# Patient Record
Sex: Female | Born: 1990 | Race: White | Hispanic: No | Marital: Married | Smoking: Current every day smoker
Health system: Southern US, Community
[De-identification: ages and names within clinical notes are randomized; demographics above are authoritative.]

## PROBLEM LIST (undated history)

## (undated) DIAGNOSIS — F319 Bipolar disorder, unspecified: Secondary | ICD-10-CM

## (undated) DIAGNOSIS — B2790 Infectious mononucleosis, unspecified without complication: Secondary | ICD-10-CM

## (undated) DIAGNOSIS — F909 Attention-deficit hyperactivity disorder, unspecified type: Secondary | ICD-10-CM

## (undated) DIAGNOSIS — K900 Celiac disease: Secondary | ICD-10-CM

## (undated) DIAGNOSIS — M1990 Unspecified osteoarthritis, unspecified site: Secondary | ICD-10-CM

## (undated) DIAGNOSIS — J45909 Unspecified asthma, uncomplicated: Secondary | ICD-10-CM

## (undated) DIAGNOSIS — N83209 Unspecified ovarian cyst, unspecified side: Secondary | ICD-10-CM

## (undated) DIAGNOSIS — B009 Herpesviral infection, unspecified: Secondary | ICD-10-CM

## (undated) DIAGNOSIS — F4310 Post-traumatic stress disorder, unspecified: Secondary | ICD-10-CM

## (undated) DIAGNOSIS — F329 Major depressive disorder, single episode, unspecified: Secondary | ICD-10-CM

## (undated) DIAGNOSIS — T7840XA Allergy, unspecified, initial encounter: Secondary | ICD-10-CM

## (undated) DIAGNOSIS — F419 Anxiety disorder, unspecified: Secondary | ICD-10-CM

## (undated) DIAGNOSIS — I639 Cerebral infarction, unspecified: Secondary | ICD-10-CM

## (undated) DIAGNOSIS — F32A Depression, unspecified: Secondary | ICD-10-CM

## (undated) HISTORY — PX: APPENDECTOMY: SHX54

## (undated) HISTORY — DX: Infectious mononucleosis, unspecified without complication: B27.90

## (undated) HISTORY — DX: Cerebral infarction, unspecified: I63.9

## (undated) HISTORY — DX: Unspecified asthma, uncomplicated: J45.909

## (undated) HISTORY — DX: Unspecified osteoarthritis, unspecified site: M19.90

## (undated) HISTORY — DX: Bipolar disorder, unspecified: F31.9

## (undated) HISTORY — DX: Unspecified ovarian cyst, unspecified side: N83.209

## (undated) HISTORY — DX: Attention-deficit hyperactivity disorder, unspecified type: F90.9

## (undated) HISTORY — DX: Allergy, unspecified, initial encounter: T78.40XA

## (undated) HISTORY — DX: Herpesviral infection, unspecified: B00.9

---
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
## 2010-07-08 DIAGNOSIS — B2790 Infectious mononucleosis, unspecified without complication: Secondary | ICD-10-CM

## 2010-07-08 HISTORY — DX: Infectious mononucleosis, unspecified without complication: B27.90

## 2011-02-21 ENCOUNTER — Emergency Department (HOSPITAL_COMMUNITY)
Admission: EM | Admit: 2011-02-21 | Discharge: 2011-02-22 | Disposition: A | Discharge status: Other Acute Inpatient Hospital | Payer: BC Managed Care – PPO | Attending: Emergency Medicine | Admitting: Emergency Medicine

## 2011-02-21 DIAGNOSIS — R45851 Suicidal ideations: Secondary | ICD-10-CM | POA: Insufficient documentation

## 2011-02-21 DIAGNOSIS — Z79899 Other long term (current) drug therapy: Secondary | ICD-10-CM | POA: Insufficient documentation

## 2011-02-21 DIAGNOSIS — F329 Major depressive disorder, single episode, unspecified: Secondary | ICD-10-CM | POA: Insufficient documentation

## 2011-02-21 DIAGNOSIS — F3289 Other specified depressive episodes: Secondary | ICD-10-CM | POA: Insufficient documentation

## 2011-02-21 DIAGNOSIS — R197 Diarrhea, unspecified: Secondary | ICD-10-CM | POA: Insufficient documentation

## 2011-02-21 DIAGNOSIS — R112 Nausea with vomiting, unspecified: Secondary | ICD-10-CM | POA: Insufficient documentation

## 2011-02-21 LAB — RAPID URINE DRUG SCREEN, HOSP PERFORMED
Amphetamines: NOT DETECTED
Barbiturates: NOT DETECTED
Benzodiazepines: NOT DETECTED
Cocaine: NOT DETECTED
Tetrahydrocannabinol: POSITIVE — AB

## 2011-02-21 LAB — COMPREHENSIVE METABOLIC PANEL
ALT: 11 U/L (ref 0–35)
AST: 12 U/L (ref 0–37)
Albumin: 4.6 g/dL (ref 3.5–5.2)
CO2: 25 mEq/L (ref 19–32)
Calcium: 10 mg/dL (ref 8.4–10.5)
Chloride: 103 mEq/L (ref 96–112)
Creatinine, Ser: 0.68 mg/dL (ref 0.50–1.10)
GFR calc non Af Amer: >60 mL/min (ref >60)
Sodium: 140 mEq/L (ref 135–145)

## 2011-02-21 LAB — CBC
Platelets: 230 10*3/uL (ref 150–400)
RBC: 4.71 MIL/uL (ref 3.87–5.11)
RDW: 12.6 % (ref 11.5–15.5)
WBC: 9.7 10*3/uL (ref 4.0–10.5)

## 2011-02-21 LAB — DIFFERENTIAL
Basophils Relative: 0 % (ref 0–1)
Eosinophils Absolute: 0.1 10*3/uL (ref 0.0–0.7)
Eosinophils Relative: 1 % (ref 0–5)
Neutrophils Relative %: 79 % — ABNORMAL HIGH (ref 43–77)

## 2011-02-22 ENCOUNTER — Inpatient Hospital Stay (HOSPITAL_COMMUNITY)
Admission: AD | Admit: 2011-02-22 | Discharge: 2011-02-25 | DRG: 430 | Disposition: A | Discharge status: Home / Self Care | Payer: BC Managed Care – PPO | Source: Ambulatory Visit | Attending: Psychiatry | Admitting: Psychiatry

## 2011-02-22 DIAGNOSIS — R45851 Suicidal ideations: Secondary | ICD-10-CM

## 2011-02-22 DIAGNOSIS — R197 Diarrhea, unspecified: Secondary | ICD-10-CM

## 2011-02-22 DIAGNOSIS — Z6379 Other stressful life events affecting family and household: Secondary | ICD-10-CM

## 2011-02-22 DIAGNOSIS — F319 Bipolar disorder, unspecified: Principal | ICD-10-CM

## 2011-02-22 DIAGNOSIS — R112 Nausea with vomiting, unspecified: Secondary | ICD-10-CM

## 2011-02-22 DIAGNOSIS — Z818 Family history of other mental and behavioral disorders: Secondary | ICD-10-CM

## 2011-02-23 DIAGNOSIS — F3130 Bipolar disorder, current episode depressed, mild or moderate severity, unspecified: Secondary | ICD-10-CM

## 2011-02-24 LAB — TSH: TSH: 1.238 u[IU]/mL (ref 0.350–4.500)

## 2011-02-24 LAB — T4, FREE: Free T4: 1.19 ng/dL (ref 0.80–1.80)

## 2011-03-11 NOTE — Assessment & Plan Note (Signed)
NAMEMARGARETH, Shelby Bennett NO.:  [id number]  MEDICAL RECORD NO.:  [id number]  LOCATION:  0981                          FACILITY:  [facility]  PHYSICIAN:  Franchot Gallo, MD     DATE OF BIRTH:  [birth year]  DATE OF ADMISSION:  02/22/2011 DATE OF DISCHARGE:                      PSYCHIATRIC ADMISSION ASSESSMENT   HISTORY OF PRESENT ILLNESS:  This is a 20 year old single white female. She apparently came here to the [facility] and promptly vomited, so she was sent to [facility] for medical clearance.  She reported to them that she had been having vomiting and diarrhea for the past 4 days.  She also reported that she was suicidal and stated, "if I had a gun, I would shoot myself."  Apparently, she lost a friend to an OD back in April.  This resulted in the ultimate breakup between her and her boyfriend of 4 years.  She abruptly left.  She states that she worries about things she cannot control, especially at night, and she has many psychosocial stressors.  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  PAST PSYCHIATRIC HISTORY:  She was admitted here as an adolescent in 2007.  SOCIAL HISTORY:  She is a high school graduate in 2008.  She has never married.  She has no children.  She is employed as a Teacher, adult education.  FAMILY HISTORY:  Her whole family has depression.  Her mother is bipolar.  Her father is an addict.  ALCOHOL AND DRUG HISTORY:  She herself usually uses marijuana on a daily basis, although since her breakup with her boyfriend, she has been trying to stop.  She, however, was positive on her urine.  PRIMARY CARE PROVIDER:  She sees a PA at [facility], Ms. Leotis Shames.  MEDICAL PROBLEMS:  She does not have any that have been diagnosed although for months now she has been having diarrhea and vomiting.  MEDICATIONS:  She restarted an old  prescription for Prozac 40 mg p.o. daily about a week ago.  She is also prescribed Klonopin 1 mg to help her sleep at night and 0.5 to help abort panic attacks.  She states that she prefers self-soothing methods, and she practices meditation and breathing although she does on occasion use the Klonopin.  DRUG ALLERGIES:  VOLTAREN AND EQUETRO.  POSITIVE PHYSICAL FINDINGS:  This is well-developed, well-nourished white female who appears her stated age of 73.  She reports a 30-pound weight loss over the past 6 months that was unintended.  Her temperature is afebrile 97.8 to 98.5.  Her pulse ranged from 61-75.  Respirations were 18.  Blood pressure was 108/70 to 123/73.  She had no measurable alcohol.  She had no remarkable findings on her CBC, and she had no abnormalities of her B-Met.  MENTAL STATUS EXAM:  Today she is alert and oriented.  She was appropriately groomed, dressed and nourished.  Her speech is not pressured.  Her mood was slightly labile.  Her thought processes were fairly clear, rational and goal oriented.  She prefers therapy to medications.  Judgment and insight are fair.  Concentration and  memory are intact.  Intelligence is at least average.  She reports having had suicidal ideation just because she is tired of all this, and she is tired of dealing with her anxiety, but no attempts or gestures.  She is not homicidal, and she does not have any auditory or visual hallucinations.  DIAGNOSES:  Axis I:  Bipolar disorder NOS, although she wants to be evaluated for premenstrual dysphoric disorder. Axis II:  Deferred. Axis III:  Psychosomatic nausea, vomiting and diarrhea from her anxiety. Axis IV:  Grief about loss of various relationships. Axis V:  GAF 35.  PLAN:  The plan is to admit for safety and stabilization.  Dr. Dub Mikes was going to think about what medicine to prescribe for her.  She saw a therapist a few years ago, and if the therapist accepts her insurance, she  would like to go back to [employer].     [name], P.A.-C.   ______________________________ Franchot Gallo, MD    MD/MEDQ  D:  02/23/2011  T:  02/23/2011  Job:  [id number]  Electronically Signed by Jaci Lazier ADAMS P.A.-C. on 03/09/2011 12:04:12 PM Electronically Signed by Franchot Gallo MD on 03/11/2011 05:10:42 PM

## 2011-03-14 NOTE — Discharge Summary (Signed)
  NAMEALAJIA, Shelby Bennett NO.:  [id number]  MEDICAL RECORD NO.:  [id number]  LOCATION:  0508                          FACILITY:  [facility]  PHYSICIAN:  Franchot Gallo, MD     DATE OF BIRTH:  1991/05/06  DATE OF ADMISSION:  02/22/2011 DATE OF DISCHARGE:  02/25/2011                              DISCHARGE SUMMARY   REASON FOR ADMISSION:  This is a 20 year old single female who came with initial symptoms of vomiting and diarrhea for the past 4 days and reported in the emergency room that if she had a gun she would shoot herself.  She recently lost a friend to an overdose.  FINAL IMPRESSION:  Axis I:  Bipolar disorder NOS. AXIS II:  Deferred. Axis III:  Frequent GI upset. Axis IV:  Recent death of friend.  Recent breakup.  Serious illness in family. Axis V:  GAF at discharge is 70.  Her CBC within normal limits.  No abnormalities of her BMET.  SIGNIFICANT FINDINGS:  The patient was admitted to the adult milieu for safety and stabilization.  She was participating in groups.  She reported that her nausea and vomiting were probably related to stress. We resumed her Klonopin every 4 hours as needed for anxiety and her Prozac was discontinued at that time.  Paxil was restarted, and the patient still did not sleep well even with resuming her Klonopin.  On the day of discharge, the patient did report good sleep and good appetite, having mild depressive symptoms, rating it a 2 on a scale of 1- 10, and adamantly denying any suicidal or homicidal thoughts or psychotic symptoms, rating her anxiety a 3 on a scale of 1-10.  She was reporting minimal panic attacks.  She was reporting futuristic thinking and rating her hopelessness as 0 on a scale of 1 to 10.  The patient was provided education on suicide prevention and safety planning.  She reported good support from her siblings, and the patient was anxious for discharge.  Her discharge medications:  Paxil 10 mg daily, Klonopin 1  mg taking 1/2 as needed for anxiety and 1 mg at bedtime if needed for sleep.  She was to stop taking her Prozac.  Sprintec:  The patient had her own supply. The patient was take 1 tablet daily.  Her followup appointment was with Dr. Tinnie Gens, phone number [phone], on August 22 at 10:00 a.m., and Micheline Chapman, phone number [phone].  The patient was to call to schedule her appointment.     Landry Corporal, N.P.   ______________________________ Franchot Gallo, MD    JO/MEDQ  D:  03/13/2011  T:  03/13/2011  Job:  [id number]  Electronically Signed by Limmie PatriciaP. on 03/14/2011 09:08:27 AM Electronically Signed by Franchot Gallo MD on 03/14/2011 01:39:05 PM

## 2011-07-21 ENCOUNTER — Ambulatory Visit (INDEPENDENT_AMBULATORY_CARE_PROVIDER_SITE_OTHER): Payer: BC Managed Care – PPO

## 2011-07-21 DIAGNOSIS — R059 Cough, unspecified: Secondary | ICD-10-CM

## 2011-07-21 DIAGNOSIS — R05 Cough: Secondary | ICD-10-CM

## 2011-07-21 DIAGNOSIS — J029 Acute pharyngitis, unspecified: Secondary | ICD-10-CM

## 2011-07-21 DIAGNOSIS — F341 Dysthymic disorder: Secondary | ICD-10-CM

## 2011-08-05 ENCOUNTER — Ambulatory Visit (INDEPENDENT_AMBULATORY_CARE_PROVIDER_SITE_OTHER): Payer: BC Managed Care – PPO

## 2011-08-05 DIAGNOSIS — B373 Candidiasis of vulva and vagina: Secondary | ICD-10-CM

## 2011-08-31 ENCOUNTER — Ambulatory Visit (INDEPENDENT_AMBULATORY_CARE_PROVIDER_SITE_OTHER): Payer: BC Managed Care – PPO | Admitting: Family Medicine

## 2011-08-31 VITALS — BP 104/68 | HR 76 | Temp 98.7°F | Resp 16 | Ht 68.5 in | Wt 143.8 lb

## 2011-08-31 DIAGNOSIS — J029 Acute pharyngitis, unspecified: Secondary | ICD-10-CM

## 2011-08-31 LAB — POCT RAPID STREP A (OFFICE): Rapid Strep A Screen: NEGATIVE

## 2011-08-31 MED ORDER — CEFDINIR 300 MG PO CAPS: 300.0000 mg | ORAL_CAPSULE | Freq: Two times a day (BID) | ORAL | Status: DC

## 2011-08-31 MED ORDER — FLUCONAZOLE 150 MG PO TABS: 150.0000 mg | ORAL_TABLET | Freq: Once | ORAL | Status: AC

## 2011-08-31 MED ORDER — MAGIC MOUTHWASH: 5.0000 mL | Freq: Three times a day (TID) | ORAL | Status: DC | PRN

## 2011-08-31 NOTE — Progress Notes (Signed)
21 yo who was diagnosed with strep 2 weeks ago in ED and given shot and treated with ten days of amoxicillin but then developed sorethroat again two days ago with pus today.  Associated with fatigue and cough  Obj:  NAD Neck is supple  With left sided lymph node Throat is red with exudates on left Chest is clear  Results for orders placed in visit on 08/31/11  POCT RAPID STREP A (OFFICE)      Component Value Range   Rapid Strep A Screen Negative  Negative     A:  Pharyngitis  P:  Cefdinir Magic mouthwash

## 2011-09-02 ENCOUNTER — Other Ambulatory Visit: Payer: Self-pay | Admitting: Family Medicine

## 2011-09-02 MED ORDER — CLONAZEPAM 1 MG PO TABS: 1.0000 mg | ORAL_TABLET | Freq: Every day | ORAL | Status: DC

## 2011-09-05 ENCOUNTER — Encounter (HOSPITAL_COMMUNITY): Payer: Self-pay | Admitting: *Deleted

## 2011-09-05 ENCOUNTER — Emergency Department (HOSPITAL_COMMUNITY)
Admission: EM | Admit: 2011-09-05 | Discharge: 2011-09-05 | Disposition: A | Discharge status: Home / Self Care | Payer: BC Managed Care – PPO | Attending: Emergency Medicine | Admitting: Emergency Medicine

## 2011-09-05 DIAGNOSIS — B9689 Other specified bacterial agents as the cause of diseases classified elsewhere: Secondary | ICD-10-CM | POA: Insufficient documentation

## 2011-09-05 DIAGNOSIS — R5383 Other fatigue: Secondary | ICD-10-CM | POA: Insufficient documentation

## 2011-09-05 DIAGNOSIS — R1032 Left lower quadrant pain: Secondary | ICD-10-CM | POA: Insufficient documentation

## 2011-09-05 DIAGNOSIS — N760 Acute vaginitis: Secondary | ICD-10-CM | POA: Insufficient documentation

## 2011-09-05 DIAGNOSIS — R634 Abnormal weight loss: Secondary | ICD-10-CM | POA: Insufficient documentation

## 2011-09-05 DIAGNOSIS — R509 Fever, unspecified: Secondary | ICD-10-CM | POA: Insufficient documentation

## 2011-09-05 DIAGNOSIS — A499 Bacterial infection, unspecified: Secondary | ICD-10-CM | POA: Insufficient documentation

## 2011-09-05 DIAGNOSIS — R5381 Other malaise: Secondary | ICD-10-CM | POA: Insufficient documentation

## 2011-09-05 DIAGNOSIS — B2790 Infectious mononucleosis, unspecified without complication: Secondary | ICD-10-CM | POA: Insufficient documentation

## 2011-09-05 DIAGNOSIS — R197 Diarrhea, unspecified: Secondary | ICD-10-CM | POA: Insufficient documentation

## 2011-09-05 DIAGNOSIS — R1012 Left upper quadrant pain: Secondary | ICD-10-CM | POA: Insufficient documentation

## 2011-09-05 DIAGNOSIS — R61 Generalized hyperhidrosis: Secondary | ICD-10-CM | POA: Insufficient documentation

## 2011-09-05 HISTORY — DX: Major depressive disorder, single episode, unspecified: F32.9

## 2011-09-05 HISTORY — DX: Depression, unspecified: F32.A

## 2011-09-05 HISTORY — DX: Celiac disease: K90.0

## 2011-09-05 HISTORY — DX: Anxiety disorder, unspecified: F41.9

## 2011-09-05 LAB — URINE MICROSCOPIC-ADD ON

## 2011-09-05 LAB — URINALYSIS, ROUTINE W REFLEX MICROSCOPIC
Bilirubin Urine: NEGATIVE
Ketones, ur: 40 mg/dL — AB
Nitrite: NEGATIVE
Protein, ur: NEGATIVE mg/dL
Urobilinogen, UA: 1 mg/dL (ref 0.0–1.0)
pH: 6.5 (ref 5.0–8.0)

## 2011-09-05 LAB — DIFFERENTIAL
Basophils Absolute: 0.1 10*3/uL (ref 0.0–0.1)
Basophils Relative: 2 % — ABNORMAL HIGH (ref 0–1)
Eosinophils Absolute: 0 10*3/uL (ref 0.0–0.7)
Lymphocytes Relative: 41 % (ref 12–46)
Monocytes Absolute: 0.4 10*3/uL (ref 0.1–1.0)
Neutrophils Relative %: 49 % (ref 43–77)

## 2011-09-05 LAB — CBC
MCH: 31 pg (ref 26.0–34.0)
MCHC: 34.4 g/dL (ref 30.0–36.0)
Platelets: 199 10*3/uL (ref 150–400)
RDW: 12.6 % (ref 11.5–15.5)

## 2011-09-05 LAB — BASIC METABOLIC PANEL
BUN: 13 mg/dL (ref 6–23)
GFR calc non Af Amer: >90 mL/min (ref >90)
Glucose, Bld: 75 mg/dL (ref 70–99)
Potassium: 3.5 mEq/L (ref 3.5–5.1)

## 2011-09-05 LAB — MONONUCLEOSIS SCREEN: Mono Screen: NEGATIVE

## 2011-09-05 LAB — WET PREP, GENITAL

## 2011-09-05 MED ORDER — METOCLOPRAMIDE HCL 5 MG/ML IJ SOLN
10.0000 mg | Freq: Once | INTRAMUSCULAR | Status: AC
  Administered 2011-09-05: 10 mg via INTRAMUSCULAR
  Filled 2011-09-05: qty 2

## 2011-09-05 MED ORDER — DEXAMETHASONE 1 MG/ML PO CONC
10.0000 mg | Freq: Once | ORAL | Status: AC
  Administered 2011-09-05: 10 mg via ORAL
  Filled 2011-09-05: qty 10

## 2011-09-05 MED ORDER — DIPHENHYDRAMINE HCL 50 MG/ML IJ SOLN
25.0000 mg | Freq: Once | INTRAMUSCULAR | Status: AC
  Administered 2011-09-05: 25 mg via INTRAMUSCULAR
  Filled 2011-09-05: qty 1

## 2011-09-05 MED ORDER — METRONIDAZOLE 500 MG PO TABS: 500.0000 mg | ORAL_TABLET | Freq: Two times a day (BID) | ORAL | Status: AC

## 2011-09-05 NOTE — ED Notes (Signed)
Pt states "I have been having trouble with my tonsils since 1/24, actually 7 days before that, am on the 2nd course of abx and it's still the same, my tonsils were the size of golf balls, can someone just take them out, I also am having abd pain"; pt indicates LLQ pain. Has been having diarrhea; pt presents with exudate to tonsils that are red & swollen.

## 2011-09-05 NOTE — Discharge Instructions (Signed)
Bacterial Vaginosis     Bacterial vaginosis (BV) is a vaginal infection where the normal balance of bacteria in the vagina is disrupted. The normal balance is then replaced by an overgrowth of certain bacteria. There are several different kinds of bacteria that can cause BV. BV is the most common vaginal infection in women of childbearing age.  CAUSES    · The cause of BV is not fully understood. BV develops when there is an increase or imbalance of harmful bacteria.   · Some activities or behaviors can upset the normal balance of bacteria in the vagina and put women at increased risk including:   · Having a new sex partner or multiple sex partners.   · Douching.   · Using an intrauterine device (IUD) for contraception.   · It is not clear what role sexual activity plays in the development of BV. However, women that have never had sexual intercourse are rarely infected with BV.   Women do not get BV from toilet seats, bedding, swimming pools or from touching objects around them.    SYMPTOMS    · Grey vaginal discharge.   · A fish-like odor with discharge, especially after sexual intercourse.   · Itching or burning of the vagina and vulva.   · Burning or pain with urination.   · Some women have no signs or symptoms at all.   DIAGNOSIS    Your caregiver must examine the vagina for signs of BV. Your caregiver will perform lab tests and look at the sample of vaginal fluid through a microscope. They will look for bacteria and abnormal cells (clue cells), a pH test higher than 4.5, and a positive amine test all associated with BV.    RISKS AND COMPLICATIONS    · Pelvic inflammatory disease (PID).   · Infections following gynecology surgery.   · Developing HIV.   · Developing herpes virus.   TREATMENT    Sometimes BV will clear up without treatment. However, all women with symptoms of BV should be treated to avoid complications, especially if gynecology surgery is planned. Female partners generally do not need to be treated.  However, BV may spread between female sex partners so treatment is helpful in preventing a recurrence of BV.    · BV may be treated with antibiotics. The antibiotics come in either pill or vaginal cream forms. Either can be used with nonpregnant or pregnant women, but the recommended dosages differ. These antibiotics are not harmful to the baby.   · BV can recur after treatment. If this happens, a second round of antibiotics will often be prescribed.   · Treatment is important for pregnant women. If not treated, BV can cause a premature delivery, especially for a pregnant woman who had a premature birth in the past. All pregnant women who have symptoms of BV should be checked and treated.   · For chronic reoccurrence of BV, treatment with a type of prescribed gel vaginally twice a week is helpful.   HOME CARE INSTRUCTIONS    · Finish all medication as directed by your caregiver.   · Do not have sex until treatment is completed.   · Tell your sexual partner that you have a vaginal infection. They should see their caregiver and be treated if they have problems, such as a mild rash or itching.   · Practice safe sex. Use condoms. Only have 1 sex partner.   PREVENTION    Basic prevention steps can help reduce   the risk of upsetting the natural balance of bacteria in the vagina and developing BV:  · Do not have sexual intercourse (be abstinent).   · Do not douche.   · Use all of the medicine prescribed for treatment of BV, even if the signs and symptoms go away.   · Tell your sex partner if you have BV. That way, they can be treated, if needed, to prevent reoccurrence.   SEEK MEDICAL CARE IF:    · Your symptoms are not improving after 3 days of treatment.   · You have increased discharge, pain, or fever.   MAKE SURE YOU:    · Understand these instructions.   · Will watch your condition.   · Will get help right away if you are not doing well or get worse.   FOR MORE INFORMATION    Division of STD Prevention (DSTDP), Centers  for Disease Control and Prevention: www.cdc.gov/std  American Social Health Association (ASHA): www.ashastd.org    Document Released: 06/24/2005 Document Revised: 03/06/2011 Document Reviewed: 12/15/2008  ExitCare® Patient Information ©2012 [employer].Bacterial Vaginosis     Bacterial vaginosis (BV) is a vaginal infection where the normal balance of bacteria in the vagina is disrupted. The normal balance is then replaced by an overgrowth of certain bacteria. There are several different kinds of bacteria that can cause BV. BV is the most common vaginal infection in women of childbearing age.  CAUSES    · The cause of BV is not fully understood. BV develops when there is an increase or imbalance of harmful bacteria.   · Some activities or behaviors can upset the normal balance of bacteria in the vagina and put women at increased risk including:   · Having a new sex partner or multiple sex partners.   · Douching.   · Using an intrauterine device (IUD) for contraception.   · It is not clear what role sexual activity plays in the development of BV. However, women that have never had sexual intercourse are rarely infected with BV.   Women do not get BV from toilet seats, bedding, swimming pools or from touching objects around them.    SYMPTOMS    · Grey vaginal discharge.   · A fish-like odor with discharge, especially after sexual intercourse.   · Itching or burning of the vagina and vulva.   · Burning or pain with urination.   · Some women have no signs or symptoms at all.   DIAGNOSIS    Your caregiver must examine the vagina for signs of BV. Your caregiver will perform lab tests and look at the sample of vaginal fluid through a microscope. They will look for bacteria and abnormal cells (clue cells), a pH test higher than 4.5, and a positive amine test all associated with BV.    RISKS AND COMPLICATIONS    · Pelvic inflammatory disease (PID).   · Infections following gynecology surgery.   · Developing HIV.    · Developing herpes virus.   TREATMENT    Sometimes BV will clear up without treatment. However, all women with symptoms of BV should be treated to avoid complications, especially if gynecology surgery is planned. Female partners generally do not need to be treated. However, BV may spread between female sex partners so treatment is helpful in preventing a recurrence of BV.    · BV may be treated with antibiotics. The antibiotics come in either pill or vaginal cream forms. Either can be used with nonpregnant or   pregnant women, but the recommended dosages differ. These antibiotics are not harmful to the baby.   · BV can recur after treatment. If this happens, a second round of antibiotics will often be prescribed.   · Treatment is important for pregnant women. If not treated, BV can cause a premature delivery, especially for a pregnant woman who had a premature birth in the past. All pregnant women who have symptoms of BV should be checked and treated.   · For chronic reoccurrence of BV, treatment with a type of prescribed gel vaginally twice a week is helpful.   HOME CARE INSTRUCTIONS    · Finish all medication as directed by your caregiver.   · Do not have sex until treatment is completed.   · Tell your sexual partner that you have a vaginal infection. They should see their caregiver and be treated if they have problems, such as a mild rash or itching.   · Practice safe sex. Use condoms. Only have 1 sex partner.   PREVENTION    Basic prevention steps can help reduce the risk of upsetting the natural balance of bacteria in the vagina and developing BV:  · Do not have sexual intercourse (be abstinent).   · Do not douche.   · Use all of the medicine prescribed for treatment of BV, even if the signs and symptoms go away.   · Tell your sex partner if you have BV. That way, they can be treated, if needed, to prevent reoccurrence.   SEEK MEDICAL CARE IF:    · Your symptoms are not improving after 3 days of treatment.    · You have increased discharge, pain, or fever.   MAKE SURE YOU:    · Understand these instructions.   · Will watch your condition.   · Will get help right away if you are not doing well or get worse.   FOR MORE INFORMATION    Division of STD Prevention (DSTDP), Centers for Disease Control and Prevention: www.cdc.gov/std  American Social Health Association (ASHA): www.ashastd.org    Document Released: 06/24/2005 Document Revised: 03/06/2011 Document Reviewed: 12/15/2008  ExitCare® Patient Information ©2012 [employer].

## 2011-09-05 NOTE — ED Provider Notes (Signed)
History     CSN: [id number]  Arrival date & time 09/05/11  1413   First MD Initiated Contact with Patient 09/05/11 1525      Chief Complaint  Patient presents with  . Sore Throat    (Consider location/radiation/quality/duration/timing/severity/associated sxs/prior treatment) HPI Comments: Patient reports that she has had a sore throat for the past month.  She has finished a course of Amoxicillin and is currently on day 5 of Omnicef.  She reports that her throat is not getting better.  She reports that she has also been more fatigued over the past month and has had night sweats and chills.  She reports that yesterday she began having LLQ and LUQ abdominal pain.  She reports that she has been seen by her PCP for this complaint but has never been tested for mono.  She reports that she has had fevers intermittently over the past month.  She has also had diarrhea over the past 3 days.  She denies any nausea or vomiting. She also reports that she has lost over 50 lbs in the past year.  She has seen PCP for this complaint and is frustrated that more work up was not done.  She reports that she is sexually active and that her and her partner do not use protection.  The history is provided by the patient.    Past Medical History  Diagnosis Date  . Depression   . Anxiety   . Celiac disease     Past Surgical History  Procedure Date  . Appendectomy     No family history on file.  History  Substance Use Topics  . Smoking status: Former Smoker -- 0.4 packs/day    Quit date: 08/29/2011  . Smokeless tobacco: Not on file  . Alcohol Use: Yes     rarely    OB History    Grav Para Term Preterm Abortions TAB SAB Ect Mult Living                  Review of Systems  Constitutional: Positive for fever, chills, appetite change and fatigue.  HENT: Positive for sore throat. Negative for ear pain, trouble swallowing, neck pain, neck stiffness and voice change.   Respiratory: Negative for  shortness of breath.   Gastrointestinal: Positive for abdominal pain. Negative for nausea and vomiting.  Genitourinary: Positive for dysuria, hematuria and vaginal discharge. Negative for urgency, flank pain, decreased urine volume and genital sores.  Skin: Negative for rash.  Neurological: Negative for dizziness, syncope and light-headedness.  Hematological: Negative for adenopathy.    Allergies  Voltaren and Zithromax  Home Medications   Current Outpatient Rx  Name Route Sig Dispense Refill  . MAGIC MOUTHWASH Oral Take 5 mLs by mouth 3 (three) times daily as needed. 120 mL 0  . CEFDINIR 300 MG PO CAPS Oral Take 300 mg by mouth 2 (two) times daily. Started on 08-31-11 for 7 days. Pt's on day 5 of therapy    . CLONAZEPAM 1 MG PO TABS Oral Take 1 tablet (1 mg total) by mouth daily. 30 tablet 0  . NORGESTIMATE-ETH ESTRADIOL 0.25-35 MG-MCG PO TABS Oral Take 1 tablet by mouth daily.    Marland Kitchen PAROXETINE HCL 20 MG PO TABS Oral Take 20 mg by mouth every morning.    Marland Kitchen PHENOL 1.4 % MT LIQD Mouth/Throat Use as directed 1 spray in the mouth or throat as needed.      BP 99/59  Pulse 68  Temp(Src) 98.5 F (  36.9 C) (Oral)  Resp 18  Wt 140 lb 6.4 oz (63.685 kg)  SpO2 100%  LMP 08/26/2011  Physical Exam  Nursing note and vitals reviewed. Constitutional: She is oriented to person, place, and time. She appears well-developed and well-nourished. No distress.  HENT:  Head: Normocephalic and atraumatic. No trismus in the jaw.  Nose: Nose normal.  Mouth/Throat: Uvula is midline and mucous membranes are normal. No uvula swelling. Oropharyngeal exudate, posterior oropharyngeal edema and posterior oropharyngeal erythema present. No tonsillar abscesses.       Uvula midline No muffled voice.  No drooling. No signs of peritonsillar abscess.  Neck: Normal range of motion. Neck supple.  Cardiovascular: Normal rate, regular rhythm and normal heart sounds.   Pulmonary/Chest: Effort normal and breath sounds  normal.  Abdominal: Soft. Normal appearance and bowel sounds are normal. She exhibits no mass. There is no hepatosplenomegaly. There is tenderness in the left upper quadrant and left lower quadrant. There is no rebound and no guarding.  Genitourinary: Vagina normal and uterus normal. There is no lesion on the right labia. There is no lesion on the left labia. Cervix exhibits discharge. Cervix exhibits no motion tenderness. Right adnexum displays no mass, no tenderness and no fullness. Left adnexum displays no mass, no tenderness and no fullness.  Musculoskeletal: Normal range of motion.  Neurological: She is alert and oriented to person, place, and time.  Skin: Skin is warm and dry. No rash noted. She is not diaphoretic. No erythema.  Psychiatric: She has a normal mood and affect.    ED Course  Procedures (including critical care time)   Labs Reviewed  MONONUCLEOSIS SCREEN  URINALYSIS, ROUTINE W REFLEX MICROSCOPIC   No results found.   No diagnosis found.    MDM  Patient with sore throat and fatigue for the past month.  She has been treated with a course of Amoxicillin and is currently on day 5 of Omnicef and sore throat not improving.  Therefore, do not feel that patient's symptoms are caused by strep.  Mono spot done in the ED which was negative.  However, CBC with diff showed atypical lymphocytes, which is consistent with mononucleosis.  Due to symptoms along with atypical lymphs patient educated on mono and instructed to avoid contact sports/activities for 6 weeks.  Regarding the weight loss, patient was instructed to follow up with PCP for additional testing, including thyroid testing.  HIV antibody testing also ordered with patient's permission, which was negative.  GC/Chlamydia still pending.  Wet prep showed BV and patient given Rx for Flagyl.  Patient instructed not to drink EtOH while on this medication.        Pascal Lux [place], PA-C 09/06/11 0140

## 2011-09-06 LAB — PATHOLOGIST SMEAR REVIEW

## 2011-09-06 LAB — GC/CHLAMYDIA PROBE AMP, GENITAL
Chlamydia, DNA Probe: NEGATIVE
GC Probe Amp, Genital: NEGATIVE

## 2011-09-06 LAB — POCT PREGNANCY, URINE: Preg Test, Ur: NEGATIVE

## 2011-09-10 NOTE — ED Provider Notes (Signed)
Medical screening examination/treatment/procedure(s) were conducted as a shared visit with non-physician practitioner(s) and myself.  I personally evaluated the patient during the encounter.  Patient with a sore throat for several weeks. Negative strep test. On exam she does have an exudative pharyngitis. Suspect this is viral. Monospot was negative but doesn't completely rule out mononucleosis. Patient has multiple other complaints including significant unintentional weight loss. Physical exam is otherwise nonfocal. Patient is in no acute distress. She seemed dynamically stable. Discussed that although her other symptoms he further evaluation, do not feel that needs states he cannot emergent basis. Patient is apparently unhappy with her current primary care provider. Stressed importance of getting a new one then following that. Resources referral is provided.  Raeford Razor, MD 09/10/11 2242

## 2011-10-04 ENCOUNTER — Telehealth: Payer: Self-pay

## 2011-10-04 NOTE — Telephone Encounter (Signed)
PT STATES SHE IS OUT OF HER CLONAZEPAM AND NEED ASAP. THE PHARMACY HAVE BEEN TRYING TO FAX. PLEASE CALL [phone]   [facility] [address]

## 2011-10-05 NOTE — Telephone Encounter (Signed)
Chart please  Alycia Rossetti

## 2011-10-07 MED ORDER — CLONAZEPAM 1 MG PO TABS: 1.0000 mg | ORAL_TABLET | Freq: Every day | ORAL | Status: DC

## 2011-10-07 NOTE — Telephone Encounter (Signed)
Pt called in requesting her refill on Clonazepam 1 mg.  Refill called into [facility] on [address].

## 2011-10-07 NOTE — Telephone Encounter (Signed)
Chart pulled to PA 

## 2012-04-14 ENCOUNTER — Ambulatory Visit (INDEPENDENT_AMBULATORY_CARE_PROVIDER_SITE_OTHER): Payer: BC Managed Care – PPO | Admitting: Physician Assistant

## 2012-04-14 VITALS — BP 124/82 | HR 69 | Temp 98.3°F | Resp 17 | Ht 69.0 in | Wt 145.0 lb

## 2012-04-14 DIAGNOSIS — F419 Anxiety disorder, unspecified: Secondary | ICD-10-CM | POA: Insufficient documentation

## 2012-04-14 DIAGNOSIS — Z23 Encounter for immunization: Secondary | ICD-10-CM

## 2012-04-14 DIAGNOSIS — IMO0001 Reserved for inherently not codable concepts without codable children: Secondary | ICD-10-CM

## 2012-04-14 DIAGNOSIS — F341 Dysthymic disorder: Secondary | ICD-10-CM

## 2012-04-14 DIAGNOSIS — F329 Major depressive disorder, single episode, unspecified: Secondary | ICD-10-CM | POA: Insufficient documentation

## 2012-04-14 DIAGNOSIS — Z309 Encounter for contraceptive management, unspecified: Secondary | ICD-10-CM

## 2012-04-14 DIAGNOSIS — F909 Attention-deficit hyperactivity disorder, unspecified type: Secondary | ICD-10-CM | POA: Insufficient documentation

## 2012-04-14 DIAGNOSIS — F32A Depression, unspecified: Secondary | ICD-10-CM | POA: Insufficient documentation

## 2012-04-14 MED ORDER — CLONAZEPAM 1 MG PO TABS: 1.0000 mg | ORAL_TABLET | Freq: Every evening | ORAL | Status: DC | PRN

## 2012-04-14 MED ORDER — NORGESTIMATE-ETH ESTRADIOL 0.25-35 MG-MCG PO TABS: 1.0000 | ORAL_TABLET | Freq: Every day | ORAL | Status: DC

## 2012-04-14 MED ORDER — PAROXETINE HCL 20 MG PO TABS: 20.0000 mg | ORAL_TABLET | ORAL | Status: DC

## 2012-04-14 NOTE — Assessment & Plan Note (Signed)
Restart paroxetine and continue alprazolam at HS prn.  She has good insight.

## 2012-04-14 NOTE — Progress Notes (Signed)
Subjective:    Patient ID: Shelby Bennett, female    DOB: August 17, 1990, 21 y.o.   MRN: [id number]  HPI This 21 y.o. female presents for evaluation of depression, anxiety and insomnia.  Needs a refill of Paxil and Xanax.  Ran out of Paxil about a week ago, and since then has had thoughts of self and other harm, but no intention or plan.  She's been irritable, easy to anger, and tearful.  These were all controlled prior to running out of medication.  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  Their family enjoyed the celebration.  Her sister is in the process of adopting the child she is fostering from [place].  Review of Systems  No chest pain, SOB, HA, dizziness, vision change, N/V, diarrhea, constipation, dysuria, urinary urgency or frequency, myalgias, arthralgias or rash.   Past Medical History  Diagnosis Date  . Anxiety   . Celiac disease   . Depression   . Arthritis   . ADHD (attention deficit hyperactivity disorder)     Past Surgical History  Procedure Date  . Appendectomy     Prior to Admission medications   Medication Sig Start Date End Date Taking? Authorizing Provider  clonazePAM (KLONOPIN) 1 MG tablet Take 1 tablet (1 mg total) by mouth daily. 10/07/11  Yes Rickard Patience, PA-C  norgestimate-ethinyl estradiol (ORTHO-CYCLEN,SPRINTEC,PREVIFEM) 0.25-35 MG-MCG tablet Take 1 tablet by mouth daily.   Yes Historical Provider, MD  PARoxetine (PAXIL) 20 MG tablet Take 20 mg by mouth every morning.   Yes Historical Provider, MD    Allergies  Allergen Reactions  . 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 Zithromax (Azithromycin Dihydrate) Diarrhea and Nausea And Vomiting    History   Social History  . Marital Status: Single    Spouse Name: n/a    Number of Children: 0  . Years of Education: 12+   Occupational History  . massage therapist     [employer]   Social History Main Topics  . Smoking  status: Former Smoker -- 0.7 packs/day for 7 years    Types: Cigarettes    Quit date: 08/29/2011  . Smokeless tobacco: Never Used  . Alcohol Use: Yes     rarely  . Drug Use: 2 per week    Special: Marijuana  . Sexually Active: Not on file   Other Topics Concern  . Not on file   Social History Narrative   Lives alone.    Family History  Problem Relation Age of Onset  . Mental illness Sister     PMDD  . Cervical cancer Sister     s/p ablation  . Mental illness Brother     depression  . Mental illness Brother     depression       Objective:   Physical Exam  Blood pressure 124/82, pulse 69, temperature 98.3 F (36.8 C), temperature source Oral, resp. rate 17, height 5\' 9"  (1.753 m), weight 145 lb (65.772 kg), last menstrual period 04/13/2012, SpO2 100.00%. Body mass index is 21.41 kg/(m^2). Well-developed, well nourished WF who is awake, alert and oriented, in NAD. HEENT: [place]/AT, sclera and conjunctiva are clear.   Neck: supple, non-tender, no lymphadenopathy, thyromegaly. Heart: RRR, no murmur Lungs: normal effort, CTA Extremities: no cyanosis, clubbing or edema. Skin: warm and dry without rash. Psychologic: good mood and appropriate affect, normal speech and behavior.     Assessment &  Plan:   1. Depression  PARoxetine (PAXIL) 20 MG tablet  2. Anxiety and depression  clonazePAM (KLONOPIN) 1 MG tablet,   4. Contraception  norgestimate-ethinyl estradiol (ORTHO-CYCLEN,SPRINTEC,PREVIFEM) 0.25-35 MG-MCG tablet  5. Need for influenza vaccination  Flu vaccine greater than or equal to 3yo preservative free IM; RTC in the next 3-6 months for breast and pap.

## 2012-04-14 NOTE — Patient Instructions (Signed)
Come in for a breast exam and pap test in the next 3-6 months.

## 2012-06-22 ENCOUNTER — Ambulatory Visit (INDEPENDENT_AMBULATORY_CARE_PROVIDER_SITE_OTHER): Payer: BC Managed Care – PPO | Admitting: Emergency Medicine

## 2012-06-22 VITALS — BP 121/80 | HR 73 | Temp 98.5°F | Resp 17 | Ht 70.0 in | Wt 144.0 lb

## 2012-06-22 DIAGNOSIS — IMO0001 Reserved for inherently not codable concepts without codable children: Secondary | ICD-10-CM

## 2012-06-22 DIAGNOSIS — F329 Major depressive disorder, single episode, unspecified: Secondary | ICD-10-CM

## 2012-06-22 DIAGNOSIS — F411 Generalized anxiety disorder: Secondary | ICD-10-CM

## 2012-06-22 DIAGNOSIS — J309 Allergic rhinitis, unspecified: Secondary | ICD-10-CM

## 2012-06-22 DIAGNOSIS — J018 Other acute sinusitis: Secondary | ICD-10-CM

## 2012-06-22 MED ORDER — NORGESTIMATE-ETH ESTRADIOL 0.25-35 MG-MCG PO TABS: 1.0000 | ORAL_TABLET | Freq: Every day | ORAL | Status: DC

## 2012-06-22 MED ORDER — PAROXETINE HCL 20 MG PO TABS: 20.0000 mg | ORAL_TABLET | ORAL | Status: DC

## 2012-06-22 MED ORDER — LORATADINE 10 MG PO TABS: 10.0000 mg | ORAL_TABLET | Freq: Every day | ORAL | Status: DC

## 2012-06-22 MED ORDER — CLONAZEPAM 1 MG PO TABS: 1.0000 mg | ORAL_TABLET | Freq: Every evening | ORAL | Status: DC | PRN

## 2012-06-22 NOTE — Progress Notes (Signed)
[facility] [address], [place] [address] [phone]  Date:  06/22/2012   Name:  Shelby Bennett   DOB:  1990-09-25   MRN:  [id number]  PCP:  Drue Harr,CHELLE, PA-C    Chief Complaint: No chief complaint on file.   History of Present Illness:  Shelby Bennett is a 22 y.o. very pleasant female patient who presents with the following:  Says that her dishwasher leaked and as a consequence she has black mold growing in the cabinet and flooring.  She has a history of allergy to mold and is very concerned about her health and the risks the mold poses to her.  Since the leak, she has experienced headaches, migraines, and allergic rhinitis and itchy watery eyes.  Denies wheezing, coughing, or shortness of breath.    Patient Active Problem List  Diagnosis  . Anxiety and depression  . ADHD (attention deficit hyperactivity disorder)    Past Medical History  Diagnosis Date  . Anxiety   . Celiac disease   . Depression   . Arthritis   . ADHD (attention deficit hyperactivity disorder)   . Allergy   . Ulcer   . Stroke     Past Surgical History  Procedure Date  . Appendectomy     History  Substance Use Topics  . Smoking status: Current Every Day Smoker -- 0.7 packs/day for 7 years    Types: Cigarettes    Last Attempt to Quit: 08/29/2011  . Smokeless tobacco: Never Used  . Alcohol Use: Yes     Comment: rarely    Family History  Problem Relation Age of Onset  . Mental illness Sister     PMDD  . Cervical cancer Sister     s/p ablation  . Mental illness Brother     depression  . Mental illness Brother     depression  . Mental illness Mother     bipolar  . Migraines Father     Allergies  Allergen Reactions  . 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 Zithromax (Azithromycin Dihydrate) Diarrhea and Nausea And Vomiting  . Latex Swelling  . Equetro (Carbamazepine Er)     Suicidal rash    Medication list  has been reviewed and updated.  Current Outpatient Prescriptions on File Prior to Visit  Medication Sig Dispense Refill  . clonazePAM (KLONOPIN) 1 MG tablet Take 1 tablet (1 mg total) by mouth at bedtime as needed for anxiety.  30 tablet  0  . norgestimate-ethinyl estradiol (ORTHO-CYCLEN,SPRINTEC,PREVIFEM) 0.25-35 MG-MCG tablet Take 1 tablet by mouth daily.  3 Package  4  . PARoxetine (PAXIL) 20 MG tablet Take 1 tablet (20 mg total) by mouth every morning.  90 tablet  3    Review of Systems:  As per HPI, otherwise negative.    Physical Examination: Filed Vitals:   06/22/12 1223  BP: 121/80  Pulse: 73  Temp: 98.5 F (36.9 C)  Resp: 17   Filed Vitals:   06/22/12 1223  Height: 5\' 10"  (1.778 m)  Weight: 144 lb (65.318 kg)   Body mass index is 20.66 kg/(m^2). Ideal Body Weight: Weight in (lb) to have BMI = 25: 173.9   GEN: WDWN, NAD, Non-toxic, A & O x 3 HEENT: Atraumatic, Normocephalic. Neck supple. No masses, No LAD. Ears and Nose: No external deformity. CV: RRR, No M/G/R. No JVD. No thrill. No extra heart sounds. PULM: CTA B, no wheezes, crackles,  rhonchi. No retractions. No resp. distress. No accessory muscle use. ABD: S, NT, ND, +BS. No rebound. No HSM. EXTR: No c/c/e NEURO Normal gait.  PSYCH: Normally interactive. Conversant. Not depressed or anxious appearing.  Calm demeanor.    Assessment and Plan: Allergy to mold Depression and anxiety Refill meds claritin  Carmelina Dane, MD

## 2012-06-22 NOTE — Patient Instructions (Addendum)
Allergic Rhinitis  Allergic rhinitis is when the mucous membranes in the nose respond to allergens. Allergens are particles in the air that cause your body to have an allergic reaction. This causes you to release allergic antibodies. Through a chain of events, these eventually cause you to release histamine into the blood stream (hence the use of antihistamines). Although meant to be protective to the body, it is this release that causes your discomfort, such as frequent sneezing, congestion and an itchy runny nose.    CAUSES    The pollen allergens may come from grasses, trees, and weeds. This is seasonal allergic rhinitis, or "hay fever." Other allergens cause year-round allergic rhinitis (perennial allergic rhinitis) such as house dust mite allergen, pet dander and mold spores.    SYMPTOMS     Nasal stuffiness (congestion).   Runny, itchy nose with sneezing and tearing of the eyes.   There is often an itching of the mouth, eyes and ears.  It cannot be cured, but it can be controlled with medications.  DIAGNOSIS    If you are unable to determine the offending allergen, skin or blood testing may find it.  TREATMENT     Avoid the allergen.   Medications and allergy shots (immunotherapy) can help.   Hay fever may often be treated with antihistamines in pill or nasal spray forms. Antihistamines block the effects of histamine. There are over-the-counter medicines that may help with nasal congestion and swelling around the eyes. Check with your caregiver before taking or giving this medicine.  If the treatment above does not work, there are many new medications your caregiver can prescribe. Stronger medications may be used if initial measures are ineffective. Desensitizing injections can be used if medications and avoidance fails. Desensitization is when a patient is given ongoing shots until the body becomes less sensitive to the allergen. Make sure you follow up with your caregiver if problems continue.   SEEK MEDICAL CARE IF:     You develop fever (more than 100.5 F (38.1 C).   You develop a cough that does not stop easily (persistent).   You have shortness of breath.   You start wheezing.   Symptoms interfere with normal daily activities.  Document Released: 03/19/2001 Document Revised: 09/16/2011 Document Reviewed: 09/28/2008  Exit[employer] Patient Information 2013 [employer].

## 2012-08-14 ENCOUNTER — Telehealth: Payer: Self-pay

## 2012-08-14 NOTE — Telephone Encounter (Signed)
We can not do this without an office visit. Called her voice mail box not set up yet.

## 2012-08-14 NOTE — Telephone Encounter (Signed)
PT STATES SHE KNOW HER HAVE A YEAST INFECTION AND WOULD LIKE [place] TO CALL IN A DIFLUCAN PILL FOR HER PLEASE. THAT IS ALL SHE NEED. PLEASE CALL [phone]   [facility] [address]

## 2012-08-15 ENCOUNTER — Telehealth: Payer: Self-pay

## 2012-08-15 NOTE — Telephone Encounter (Signed)
PT NEEDS A REFILL OF DIFLUCAN.  SAID DR. TOLD HER TO CALL IF SHE GOT ANOTHER ONE TO JUST CALL us AND WE WOULD CALL HER IN SCRIPT.  SHE NO LONGER HAS INSURANCE AND IS POSITIVE THAT IT IS A YEAST INFECTION.  NEEDS IT CALLED INTO [facility] IN [place].  THIS IS A DIFFERENT PHARMACY FROM THE ONE WE HAVE ON FILE.  [phone]

## 2012-08-15 NOTE — Telephone Encounter (Signed)
Patient called again stating she is now leaving [place] and would like the Diflucan sent to [facility] on [address]. States she needs 2 and she is having a really bad yeast infection. Please call patient when sent to pharmacy.

## 2012-08-17 NOTE — Telephone Encounter (Signed)
Per last telephone note, pt needs an OV.  There are OTC yeast infection medications that she can use if she does not want to come in.

## 2012-08-18 NOTE — Telephone Encounter (Signed)
Called and advised her of this. She is using natural treatment now.

## 2012-08-18 NOTE — Telephone Encounter (Signed)
Not appropriate to do this without office visit.  Called patient. Voice mail not set up yet.

## 2012-10-09 ENCOUNTER — Inpatient Hospital Stay (HOSPITAL_COMMUNITY)
Admission: RE | Admit: 2012-10-09 | Discharge: 2012-10-12 | DRG: 885 | Disposition: A | Discharge status: Home / Self Care | Payer: 59 | Attending: Psychiatry | Admitting: Psychiatry

## 2012-10-09 ENCOUNTER — Encounter (HOSPITAL_COMMUNITY): Payer: Self-pay | Admitting: *Deleted

## 2012-10-09 DIAGNOSIS — F909 Attention-deficit hyperactivity disorder, unspecified type: Secondary | ICD-10-CM

## 2012-10-09 DIAGNOSIS — F39 Unspecified mood [affective] disorder: Secondary | ICD-10-CM | POA: Diagnosis present

## 2012-10-09 DIAGNOSIS — F319 Bipolar disorder, unspecified: Secondary | ICD-10-CM

## 2012-10-09 DIAGNOSIS — F329 Major depressive disorder, single episode, unspecified: Secondary | ICD-10-CM

## 2012-10-09 DIAGNOSIS — F3160 Bipolar disorder, current episode mixed, unspecified: Secondary | ICD-10-CM

## 2012-10-09 DIAGNOSIS — F411 Generalized anxiety disorder: Secondary | ICD-10-CM

## 2012-10-09 DIAGNOSIS — K900 Celiac disease: Secondary | ICD-10-CM | POA: Diagnosis present

## 2012-10-09 DIAGNOSIS — F3110 Bipolar disorder, current episode manic without psychotic features, unspecified: Secondary | ICD-10-CM | POA: Diagnosis present

## 2012-10-09 DIAGNOSIS — Z79899 Other long term (current) drug therapy: Secondary | ICD-10-CM

## 2012-10-09 LAB — COMPREHENSIVE METABOLIC PANEL
CO2: 28 mEq/L (ref 19–32)
Calcium: 9.1 mg/dL (ref 8.4–10.5)
Creatinine, Ser: 0.67 mg/dL (ref 0.50–1.10)
GFR calc Af Amer: >90 mL/min (ref >90)
GFR calc non Af Amer: >90 mL/min (ref >90)
Glucose, Bld: 94 mg/dL (ref 70–99)

## 2012-10-09 LAB — URINALYSIS, ROUTINE W REFLEX MICROSCOPIC
Bilirubin Urine: NEGATIVE
Glucose, UA: NEGATIVE mg/dL
Hgb urine dipstick: NEGATIVE
Ketones, ur: NEGATIVE mg/dL
Leukocytes, UA: NEGATIVE
Nitrite: NEGATIVE
Protein, ur: NEGATIVE mg/dL
Specific Gravity, Urine: 1.017 (ref 1.005–1.030)
Urobilinogen, UA: 0.2 mg/dL (ref 0.0–1.0)
pH: 7 (ref 5.0–8.0)

## 2012-10-09 LAB — CBC
HCT: 38.8 % (ref 36.0–46.0)
Hemoglobin: 13.1 g/dL (ref 12.0–15.0)
MCH: 30.8 pg (ref 26.0–34.0)
MCHC: 33.8 g/dL (ref 30.0–36.0)
MCV: 91.1 fL (ref 78.0–100.0)
Platelets: 221 10*3/uL (ref 150–400)
RBC: 4.26 MIL/uL (ref 3.87–5.11)
RDW: 12.4 % (ref 11.5–15.5)
WBC: 7.1 10*3/uL (ref 4.0–10.5)

## 2012-10-09 LAB — RAPID URINE DRUG SCREEN, HOSP PERFORMED: Opiates: NOT DETECTED

## 2012-10-09 LAB — PREGNANCY, URINE: Preg Test, Ur: NEGATIVE

## 2012-10-09 MED ORDER — LITHIUM CARBONATE 300 MG PO CAPS
300.0000 mg | ORAL_CAPSULE | Freq: Two times a day (BID) | ORAL | Status: DC
  Administered 2012-10-09 – 2012-10-12 (×7): 300 mg via ORAL
  Filled 2012-10-09 (×9): qty 1

## 2012-10-09 MED ORDER — TRAZODONE HCL 100 MG PO TABS
100.0000 mg | ORAL_TABLET | Freq: Every evening | ORAL | Status: DC | PRN
  Administered 2012-10-09: 100 mg via ORAL
  Filled 2012-10-09: qty 1

## 2012-10-09 MED ORDER — MAGNESIUM HYDROXIDE 400 MG/5ML PO SUSP: 30.0000 mL | Freq: Every day | ORAL | Status: DC | PRN

## 2012-10-09 MED ORDER — ACETAMINOPHEN 325 MG PO TABS
650.0000 mg | ORAL_TABLET | Freq: Four times a day (QID) | ORAL | Status: DC | PRN
  Administered 2012-10-09 – 2012-10-10 (×3): 650 mg via ORAL

## 2012-10-09 MED ORDER — NICOTINE 14 MG/24HR TD PT24
MEDICATED_PATCH | TRANSDERMAL | Status: AC
  Administered 2012-10-09: 17:00:00
  Filled 2012-10-09: qty 1

## 2012-10-09 MED ORDER — NICOTINE POLACRILEX 2 MG MT GUM: 2.0000 mg | CHEWING_GUM | OROMUCOSAL | Status: DC | PRN

## 2012-10-09 MED ORDER — ALUM & MAG HYDROXIDE-SIMETH 200-200-20 MG/5ML PO SUSP
30.0000 mL | ORAL | Status: DC | PRN
  Administered 2012-10-11: 30 mL via ORAL

## 2012-10-09 NOTE — Progress Notes (Signed)
Pt asked for information about filing 50-B protection order in [facility] and information pertaining to [employer] [facility] offered in [place]. This information was printed and given to pt 10/09/12. [facility] referral faxed--currently waiting for call with appt and location (therapy and med management).
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
## 2012-10-09 NOTE — Tx Team (Signed)
Initial Interdisciplinary Treatment Plan  PATIENT STRENGTHS: (choose at least two) Ability for insight Average or above average intelligence Capable of independent living Communication skills General fund of knowledge Motivation for treatment/growth Supportive family/friends Work skills  PATIENT STRESSORS: Financial difficulties Legal issue Occupational concerns   PROBLEM LIST: Problem List/Patient Goals Date to be addressed Date deferred Reason deferred Estimated date of resolution  Suicidal thoughts 10-09-12     Homicidal thoughts 10-09-12     Anxiety 10-09-12     Depression                                     DISCHARGE CRITERIA:  Ability to meet basic life and health needs Improved stabilization in mood, thinking, and/or behavior Motivation to continue treatment in a less acute level of care Need for constant or close observation no longer present Reduction of life-threatening or endangering symptoms to within safe limits  PRELIMINARY DISCHARGE PLAN: Attend PHP/IOP Outpatient therapy Participate in family therapy Return to previous living arrangement  PATIENT/FAMIILY INVOLVEMENT: This treatment plan has been presented to and reviewed with the patient, Shelby Bennett, and/or family member.  The patient and family have been given the opportunity to ask questions and make suggestions.  Mickeal Needy 10/09/2012, 2:09 AM

## 2012-10-09 NOTE — [facility] Suicide Risk Assessment (Signed)
Suicide Risk Assessment  Admission Assessment     Nursing information obtained from:  Patient Demographic factors:  Adolescent or young adult;Caucasian;Living alone;Unemployed Current Mental Status:  Self-harm thoughts;Thoughts of violence towards others Loss Factors:  Decrease in vocational status;Legal issues Historical Factors:  Family history of mental illness or substance abuse Risk Reduction Factors:  Sense of responsibility to family;Religious beliefs about death  CLINICAL FACTORS:   Severe Anxiety and/or Agitation Depression:   Impulsivity  COGNITIVE FEATURES THAT CONTRIBUTE TO RISK:  Thought constriction (tunnel vision)    SUICIDE RISK:   Moderate:  Frequent suicidal ideation with limited intensity, and duration, some specificity in terms of plans, no associated intent, good self-control, limited dysphoria/symptomatology, some risk factors present, and identifiable protective factors, including available and accessible social support.  PLAN OF CARE: Supportive approach/coping skills                               Reassess her mood disorder; Bipolar vs. Unipolar Depression  I certify that inpatient services furnished can reasonably be expected to improve the patient's condition.  Rocklyn Mayberry A 10/09/2012, 4:45 PM

## 2012-10-09 NOTE — Progress Notes (Signed)
Took over Pt's care at 2330.  Pt is resting in bed with eyes closed, even and unlabored respirations.  Will continue to [employer]

## 2012-10-09 NOTE — Progress Notes (Signed)
Adult Psychoeducational Group Note  Date:  10/09/2012 Time:  10:00a  Group Topic/Focus:  Early Warning Signs:   The focus of this group is to help patients identify signs or symptoms they exhibit before slipping into an unhealthy state or crisis.  Participation Level:  Active  Participation Quality:  Sharing  Affect:  Appropriate  Cognitive:  Appropriate  Insight: Good  Engagement in Group:  Engaged  Modes of Intervention:  Discussion  Additional Comments:  Patient was enaged in group and was able to give some examples of how she have stay sober.  [name] 10/09/2012, 12:02 PM
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
## 2012-10-09 NOTE — Progress Notes (Signed)
Shelby Bennett has been in and out of her bed most of the day. She appears anxious,reluctant to approah and speak with staff..seen frequently standing out in the hall twiddling her hair in her fingertips. When asked by this staff nurse what she needs, if anything, she replies " I don't know". Initially, she says she had a pounding headache ( she thinks it was from caffeine withdrawal) but states this abated as the day rpogressed.  A She has attended 2 groups today. She is quite limited in her acceptance and her understanding of her disease, states she has had SI within the past 24 hrs, but willing to contract with this nurse for safety. She rates her depression and hopelessness " 9-10 ./ 8", respectively and states her DC plan includes " a job".  R Safety is in place and POC cont.
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
## 2012-10-10 DIAGNOSIS — F909 Attention-deficit hyperactivity disorder, unspecified type: Secondary | ICD-10-CM

## 2012-10-10 DIAGNOSIS — F3110 Bipolar disorder, current episode manic without psychotic features, unspecified: Secondary | ICD-10-CM | POA: Diagnosis present

## 2012-10-10 DIAGNOSIS — F309 Manic episode, unspecified: Secondary | ICD-10-CM

## 2012-10-10 MED ORDER — ASPIRIN-ACETAMINOPHEN-CAFFEINE 250-250-65 MG PO TABS
2.0000 | ORAL_TABLET | Freq: Four times a day (QID) | ORAL | Status: DC | PRN
  Administered 2012-10-10 – 2012-10-11 (×3): 2 via ORAL

## 2012-10-10 MED ORDER — HOME MED STORE IN PYXIS
1.0000 | Freq: Every day | Status: DC
  Administered 2012-10-10 – 2012-10-12 (×4): 1 via OROMUCOSAL
  Filled 2012-10-10 (×3): qty 1

## 2012-10-10 MED ORDER — GABAPENTIN 100 MG PO CAPS
200.0000 mg | ORAL_CAPSULE | Freq: Every day | ORAL | Status: DC
  Administered 2012-10-10: 200 mg via ORAL
  Filled 2012-10-10 (×4): qty 2

## 2012-10-10 NOTE — Progress Notes (Signed)
D.  Pt pleasant on approach, requested medication for headache before bed because she states if she doesn't receive it at that time she will wake up with a migraine.  Positive for evening group, interacting appropriately within milieu.  Denies SI/HI/hallucinations at this time.  A.  Support and encouragement offered, medication given as ordered.  R.  Pt remains safe on unit, will continue to monitor.

## 2012-10-10 NOTE — Progress Notes (Signed)
D) Pt has been in her bed resting all day due to a headache. Pt had tylenol this morning and it did not help. Did not attend the groups this morning because of her headache.  A) was given Excedrin migraine at 1210 for her headache rated a 10 R) Pt was able to go to group this afternoon and interact with her peers.

## 2012-10-10 NOTE — [facility] Group Notes (Signed)
[facility] LCSW Group Therapy  10/10/2012 10am  Did not attend  Sarina Ser 10/10/2012, 12:40 PM

## 2012-10-10 NOTE — Progress Notes (Signed)
[facility] MD Progress Note  10/10/2012 3:30 PM Shelby Bennett  MRN:  [id number]  Subjective: "I had Horrible night-mare last night. I was bad, awful.  I couldn't get myself to wake up from it. I don't normally dream like that and not able to wake myself up. Could it be related to my medicines. I stay anxious, but I'm feeling some better"  Diagnosis:   Axis I: ADHD, hyperactive type and Bipolar affective disorder, current episode manic Axis II: Deferred Axis III:  Past Medical History  Diagnosis Date  . Anxiety   . Celiac disease   . Depression   . Arthritis   . ADHD (attention deficit hyperactivity disorder)   . Allergy   . Ulcer   . Stroke    Axis IV: other psychosocial or environmental problems Axis V: 41-50 serious symptoms  ADL's:  Intact  Sleep: Fair  Appetite:  Good  Suicidal Ideation:  Denies Homicidal Ideation:  Denies AEB (as evidenced by):  Psychiatric Specialty Exam: Review of Systems  Constitutional: Negative.   HENT: Negative.   Eyes: Negative.   Respiratory: Negative.   Cardiovascular: Negative.   Gastrointestinal: Negative.   Genitourinary: Negative.   Musculoskeletal: Negative.   Skin: Negative.   Endo/Heme/Allergies: Negative.   Psychiatric/Behavioral: Positive for substance abuse. Negative for depression, suicidal ideas, hallucinations and memory loss. The patient is nervous/anxious and has insomnia.     Blood pressure 110/76, pulse 83, temperature 98.3 F (36.8 C), temperature source Oral, resp. rate 18, height 5\' 9"  (1.753 m), weight 64.864 kg (143 lb), last menstrual period 09/08/2012.Body mass index is 21.11 kg/(m^2).  General Appearance: Casual and Neat  Eye Contact::  Fair  Speech:  Clear and Coherent  Volume:  Normal  Mood:  "Better, but still anxious"  Affect:  Appropriate  Thought Process:  Coherent and Goal Directed  Orientation:  Full (Time, Place, and Person)  Thought Content:  Rumination  Suicidal Thoughts:  No  Homicidal  Thoughts:  No  Memory:  Immediate;   Good Recent;   Good Remote;   Good  Judgement:  Fair  Insight:  Fair  Psychomotor Activity:  Normal  Concentration:  Fair  Recall:  Good  Akathisia:  No  Handed:  Right  AIMS (if indicated):     Assets:  Desire for Improvement  Sleep:  Number of Hours: 5.75   Current Medications: Current Facility-Administered Medications  Medication Dose Route Frequency Provider Last Rate Last Dose  . alum & mag hydroxide-simeth (MAALOX/MYLANTA) 200-200-20 MG/5ML suspension 30 mL  30 mL Oral Q4H PRN Mike Craze, MD      . aspirin-acetaminophen-caffeine [facility] MIGRAINE) per tablet 2 tablet  2 tablet Oral Q6H PRN Sanjuana Kava, NP   2 tablet at 10/10/12 1210  . gabapentin (NEURONTIN) capsule 200 mg  200 mg Oral QHS Sanjuana Kava, NP      . lithium carbonate capsule 300 mg  300 mg Oral BID WC Mike Craze, MD   300 mg at 10/10/12 1156  . magnesium hydroxide (MILK OF MAGNESIA) suspension 30 mL  30 mL Oral Daily PRN Mike Craze, MD      . nicotine polacrilex (NICORETTE) gum 2 mg  2 mg Oral PRN Rachael Fee, MD        Lab Results:  Results for orders placed during the hospital encounter of 10/09/12 (from the past 48 hour(s))  CBC     Status: None   Collection Time    10/09/12  6:20 AM      Result Value Range   WBC 7.1  4.0 - 10.5 K/uL   RBC 4.26  3.87 - 5.11 MIL/uL   Hemoglobin 13.1  12.0 - 15.0 g/dL   HCT 40.9  81.1 - 91.4 %   MCV 91.1  78.0 - 100.0 fL   MCH 30.8  26.0 - 34.0 pg   MCHC 33.8  30.0 - 36.0 g/dL   RDW 78.2  95.6 - 21.3 %   Platelets 221  150 - 400 K/uL  COMPREHENSIVE METABOLIC PANEL     Status: Abnormal   Collection Time    10/09/12  6:20 AM      Result Value Range   Sodium 139  135 - 145 mEq/L   Potassium 3.4 (*) 3.5 - 5.1 mEq/L   Chloride 104  96 - 112 mEq/L   CO2 28  19 - 32 mEq/L   Glucose, Bld 94  70 - 99 mg/dL   BUN 9  6 - 23 mg/dL   Creatinine, Ser 0.86  0.50 - 1.10 mg/dL   Calcium 9.1  8.4 - 57.8 mg/dL   Total  Protein 6.6  6.0 - 8.3 g/dL   Albumin 3.6  3.5 - 5.2 g/dL   AST 12  0 - 37 U/L   ALT 9  0 - 35 U/L   Alkaline Phosphatase 28 (*) 39 - 117 U/L   Total Bilirubin 0.3  0.3 - 1.2 mg/dL   GFR calc non Af Amer >90  >90 mL/min   GFR calc Af Amer >90  >90 mL/min   Comment:            The eGFR has been calculated     using the CKD EPI equation.     This calculation has not been     validated in all clinical     situations.     eGFR's persistently     <90 mL/min signify     possible Chronic Kidney Disease.  TSH     Status: None   Collection Time    10/09/12  6:20 AM      Result Value Range   TSH 2.846  0.350 - 4.500 uIU/mL  PREGNANCY, URINE     Status: None   Collection Time    10/09/12  8:00 AM      Result Value Range   Preg Test, Ur NEGATIVE  NEGATIVE   Comment:            THE SENSITIVITY OF THIS     METHODOLOGY IS >20 mIU/mL.  URINE RAPID DRUG SCREEN (HOSP PERFORMED)     Status: Abnormal   Collection Time    10/09/12  8:00 AM      Result Value Range   Opiates NONE DETECTED  NONE DETECTED   Cocaine NONE DETECTED  NONE DETECTED   Benzodiazepines NONE DETECTED  NONE DETECTED   Amphetamines NONE DETECTED  NONE DETECTED   Tetrahydrocannabinol POSITIVE (*) NONE DETECTED   Barbiturates NONE DETECTED  NONE DETECTED   Comment:            DRUG SCREEN FOR MEDICAL PURPOSES     ONLY.  IF CONFIRMATION IS NEEDED     FOR ANY PURPOSE, NOTIFY LAB     WITHIN 5 DAYS.                LOWEST DETECTABLE LIMITS     FOR URINE DRUG SCREEN     Drug Class  Cutoff (ng/mL)     Amphetamine      1000     Barbiturate      200     Benzodiazepine   200     Tricyclics       300     Opiates          300     Cocaine          300     THC              50  URINALYSIS, ROUTINE W REFLEX MICROSCOPIC     Status: Abnormal   Collection Time    10/09/12  8:00 AM      Result Value Range   Color, Urine YELLOW  YELLOW   APPearance CLOUDY (*) CLEAR   Specific Gravity, Urine 1.017  1.005 - 1.030   pH 7.0  5.0  - 8.0   Glucose, UA NEGATIVE  NEGATIVE mg/dL   Hgb urine dipstick NEGATIVE  NEGATIVE   Bilirubin Urine NEGATIVE  NEGATIVE   Ketones, ur NEGATIVE  NEGATIVE mg/dL   Protein, ur NEGATIVE  NEGATIVE mg/dL   Urobilinogen, UA 0.2  0.0 - 1.0 mg/dL   Nitrite NEGATIVE  NEGATIVE   Leukocytes, UA NEGATIVE  NEGATIVE   Comment: MICROSCOPIC NOT DONE ON URINES WITH NEGATIVE PROTEIN, BLOOD, LEUKOCYTES, NITRITE, OR GLUCOSE <1000 mg/dL.    Physical Findings: AIMS: Facial and Oral Movements Muscles of Facial Expression: None, normal Lips and Perioral Area: None, normal Jaw: None, normal Tongue: None, normal,Extremity Movements Upper (arms, wrists, hands, fingers): None, normal Lower (legs, knees, ankles, toes): None, normal, Trunk Movements Neck, shoulders, hips: None, normal, Overall Severity Severity of abnormal movements (highest score from questions above): None, normal Incapacitation due to abnormal movements: None, normal Patient's awareness of abnormal movements (rate only patient's report): No Awareness, Dental Status Current problems with teeth and/or dentures?: No Does patient usually wear dentures?: No  CIWA:  CIWA-Ar Total: 2 COWS:     Treatment Plan Summary: Daily contact with patient to assess and evaluate symptoms and progress in treatment Medication management  Plan: Supportive approach/coping skills/relapse prevention. Will initiate Neurontin 200 mg Q bedtime for symptoms of anxiety. Discontinue Trazodone. Will add Birth Control pills to list of medicines as soon as patient's family delivers it. Encouraged out of room, participation in group sessions and application of coping skills when distressed. Will continue to monitor response to/adverse effects of medications in use to assure effectiveness. Continue to monitor mood, behavior and interaction with staff and other patients. Continue current plan of care.  Medical Decision Making Problem Points:  Established problem,  worsening (2), New problem, with additional work-up planned (4), Review of last therapy session (1) and Review of psycho-social stressors (1) Data Points:  Review of medication regiment & side effects (2) Review of new medications or change in dosage (2)  I certify that inpatient services furnished can reasonably be expected to improve the patient's condition.   Armandina Stammer I 10/10/2012, 3:30 PM

## 2012-10-11 DIAGNOSIS — F39 Unspecified mood [affective] disorder: Secondary | ICD-10-CM

## 2012-10-11 DIAGNOSIS — F341 Dysthymic disorder: Secondary | ICD-10-CM

## 2012-10-11 LAB — LITHIUM LEVEL: Lithium Lvl: <0.25 mEq/L — ABNORMAL LOW (ref 0.80–1.40)

## 2012-10-11 MED ORDER — PANTOPRAZOLE SODIUM 40 MG PO TBEC
40.0000 mg | DELAYED_RELEASE_TABLET | Freq: Every day | ORAL | Status: DC
  Filled 2012-10-11 (×2): qty 1

## 2012-10-11 MED ORDER — PANTOPRAZOLE SODIUM 40 MG PO TBEC
40.0000 mg | DELAYED_RELEASE_TABLET | Freq: Every day | ORAL | Status: DC
  Administered 2012-10-11 – 2012-10-12 (×2): 40 mg via ORAL
  Filled 2012-10-11 (×5): qty 1

## 2012-10-11 NOTE — Progress Notes (Signed)
Thankfulness  Group Note  Date: 10/11/2012 Time:  0900  Group Topic/Focus:  Identifying Thankfulness :   The focus of this group is to help patients identify  Things / people / events in their lives they are thankful for..  Participation Level:  Did Not Attend  PAdditional Comments:    10/11/2012,9:46 AM Rich Brave

## 2012-10-11 NOTE — Progress Notes (Signed)
Consult from [facility] for  Shelby Bennett for abdominal pain. Patient is a 22 year old female with history of bipolar disorder, migraine headaches. Consult requested for abdominal pain. Patient reported taking aspirin and Excedrin for headaches and thinks she has ulcers. My recommendation is to avoid aspirin and Excedrin and use protonix. If protonix is not alleviating the pain then please call again and we will see the patient.  Manson Passey [facility] [phone] Pager [phone]

## 2012-10-11 NOTE — Progress Notes (Signed)
Psychoeducational Group Note  Date:  10/11/2012 Time: 1015 Group Topic/Focus:  Making Healthy Choices:   The focus of this group is to help patients identify negative/unhealthy choices they were using prior to admission and identify positive/healthier coping strategies to replace them upon discharge.  Participation Level:  Did not attend Participation Quality:   Affect:    Cognitive:    Insight:    Engagement in Group:    Additional Comments:   Shelby Bennett 4:20 PM. 10/11/2012

## 2012-10-11 NOTE — Progress Notes (Signed)
Patient ID: Shelby Bennett, female   DOB: 10-25-1990, 22 y.o.   MRN: [id number] D)   Has been participating in the milieu this evening,attended group, went to the room outside the med window afterward where several peers were meeting to play cards, and were socializing.  Was noted to be laughing and interacting appropriately.  Had put off taking her dose of lithium at suppertime, but had come to the window to get it at the beginning of the shift, but stated she didn't want to take anything she didn't have to take, stated she didn't think the neurontin had done anything for her and refused hs dose.  Did state that the protonix was helping her stomach, however and was pleased that she was feeling better.  Somewhat guarded with staff and ltd interaction. A)  Will continue to monitor for safety, continue POC R)  Safety maintained on unit.
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
## 2012-10-11 NOTE — Progress Notes (Addendum)
Patient ID: Shelby Bennett, female   DOB: 02-02-1991, 22 y.o.   MRN: [id number] [facility] MD Progress Note  10/11/2012 3:48 PM Shelby Bennett  MRN:  [id number]  Subjective: "I still did not sleep well last night. What ever you guys are giving for what ever is not working. My mood is stabilized. I should be discharged today. Why can't I be discharged from this fucking place. I need to go home to go to the pain clinic to get help for my migraine headache pains. My stomach is hurting. I know it ulcers. All the Tylenol and Excedrin headache has washed off all the linings in my stomach. What I am doing here. I need to go home".   Diagnosis:   Axis I: ADHD, hyperactive type and Bipolar affective disorder, current episode manic Axis II: Deferred Axis III:  Past Medical History  Diagnosis Date  . Anxiety   . Celiac disease   . Depression   . Arthritis   . ADHD (attention deficit hyperactivity disorder)   . Allergy   . Ulcer   . Stroke    Axis IV: other psychosocial or environmental problems Axis V: 41-50 serious symptoms  ADL's:  Intact  Sleep: Fair  Appetite:  Good  Suicidal Ideation:  Denies Homicidal Ideation:  Denies AEB (as evidenced by):  Psychiatric Specialty Exam: Review of Systems  Constitutional: Negative.   HENT: Negative.   Eyes: Negative.   Respiratory: Negative.   Cardiovascular: Negative.   Gastrointestinal: Negative.   Genitourinary: Negative.   Musculoskeletal: Negative.   Skin: Negative.   Endo/Heme/Allergies: Negative.   Psychiatric/Behavioral: Positive for substance abuse. Negative for depression, suicidal ideas, hallucinations and memory loss. The patient is nervous/anxious and has insomnia.     Blood pressure 111/77, pulse 106, temperature 97 F (36.1 C), temperature source Oral, resp. rate 16, height 5\' 9"  (1.753 m), weight 64.864 kg (143 lb), last menstrual period 09/08/2012.Body mass index is 21.11 kg/(m^2).  General Appearance: Casual and  Neat  Eye Contact::  Fair  Speech:  Clear and Coherent  Volume:  Normal  Mood:  "Better, but still anxious", yelling  Affect:  Appropriate  Thought Process:  Coherent and Goal Directed  Orientation:  Full (Time, Place, and Person)  Thought Content:  Rumination  Suicidal Thoughts:  No  Homicidal Thoughts:  No  Memory:  Immediate;   Good Recent;   Good Remote;   Good  Judgement:  Fair  Insight:  Fair  Psychomotor Activity:  Restless  Concentration:  Fair  Recall:  Good  Akathisia:  No  Handed:  Right  AIMS (if indicated):     Assets:  Desire for Improvement  Sleep:  Number of Hours: 5.25   Current Medications: Current Facility-Administered Medications  Medication Dose Route Frequency Provider Last Rate Last Dose  . alum & mag hydroxide-simeth (MAALOX/MYLANTA) 200-200-20 MG/5ML suspension 30 mL  30 mL Oral Q4H PRN Mike Craze, MD   30 mL at 10/11/12 1006  . aspirin-acetaminophen-caffeine (EXCEDRIN MIGRAINE) per tablet 2 tablet  2 tablet Oral Q6H PRN Sanjuana Kava, NP   2 tablet at 10/11/12 0605  . gabapentin (NEURONTIN) capsule 200 mg  200 mg Oral QHS Sanjuana Kava, NP   200 mg at 10/10/12 2258  . home med stored in pyxis 1 each  1 each Mouth/Throat QAC breakfast Shuvon Rankin, NP   1 each at 10/11/12 1426  . lithium carbonate capsule 300 mg  300 mg Oral BID WC Mike Craze,  MD   300 mg at 10/11/12 1005  . magnesium hydroxide (MILK OF MAGNESIA) suspension 30 mL  30 mL Oral Daily PRN Mike Craze, MD      . nicotine polacrilex (NICORETTE) gum 2 mg  2 mg Oral PRN Rachael Fee, MD        Lab Results:  Results for orders placed during the hospital encounter of 10/09/12 (from the past 48 hour(s))  LITHIUM LEVEL     Status: Abnormal   Collection Time    10/11/12  6:30 AM      Result Value Range   Lithium Lvl <0.25 (*) 0.80 - 1.40 mEq/L    Physical Findings: AIMS: Facial and Oral Movements Muscles of Facial Expression: None, normal Lips and Perioral Area: None,  normal Jaw: None, normal Tongue: None, normal,Extremity Movements Upper (arms, wrists, hands, fingers): None, normal Lower (legs, knees, ankles, toes): None, normal, Trunk Movements Neck, shoulders, hips: None, normal, Overall Severity Severity of abnormal movements (highest score from questions above): None, normal Incapacitation due to abnormal movements: None, normal Patient's awareness of abnormal movements (rate only patient's report): No Awareness, Dental Status Current problems with teeth and/or dentures?: No Does patient usually wear dentures?: No  CIWA:  CIWA-Ar Total: 0 COWS:     Treatment Plan Summary: Daily contact with patient to assess and evaluate symptoms and progress in treatment Medication management  Plan: Supportive approach/coping skills/relapse prevention. Consult primary care for stomach pains. Set boundaries for inappropriate yelling.  Continue Neurontin 200 mg Q bedtime for symptoms of anxiety. Discontinue Trazodone. Will add Birth Control pills to list of medicines as soon as patient's family delivers it. Encouraged out of room, participation in group sessions and application of coping skills when distressed. Will continue to monitor response to/adverse effects of medications in use to assure effectiveness. Continue to monitor mood, behavior and interaction with staff and other patients. Continue current plan of care.  Medical Decision Making Problem Points:  Established problem, worsening (2), New problem, with additional work-up planned (4), Review of last therapy session (1) and Review of psycho-social stressors (1) Data Points:  Review of medication regiment & side effects (2) Review of new medications or change in dosage (2)  I certify that inpatient services furnished can reasonably be expected to improve the patient's condition.   Armandina Stammer I 10/11/2012, 3:48 PM

## 2012-10-11 NOTE — Progress Notes (Signed)
Patient ID: Shelby Bennett, female   DOB: [birth year], 22 y.o.   MRN: [id number]  S: Burning pin to upper left quadrant that radiates to left lower abd.  O: Patient is crying, holding her abdomen. States, "I know it is ulcers. I was too to much aspirin and Tylenol tabs that destroyed the lining of my stomach.  A. Add pain  P: Called Primary care consult for evaluation and recommendation. Dr, Elisabeth Pigeon responded to order and administer Protonix and monitor patient. If Protonix did relieve the pain, call back and patient will be seen.

## 2012-10-11 NOTE — Progress Notes (Addendum)
Pt stated ," I need to get out of here and get my stomach taken care of and my headaches." Pt did sign a 72 hour at 10:15 am today. Pt has been in her room most of the am and states her head pain is now a 1/10. Pt was given 30cc of maalox for stomach pain. Poor eye contact and just keeps saying,:"I need to get out of here and go to a pain clinic."Pt stated I think I have an ulcer." Informed pt I would have the NP made aware. Pt does contract for safety and denies SI or HI. Pt does appear anxious and preoccupied about leaving.pt crying that her stomach hurts. No rebound tenderness and soft and nontender. NP made aware and general interenst will come and see pt. Pt given a warm compress for abd. Pt was taken out of group as the group leader stated she was being very disruptive. NP back into see pt. Pt appears to cry when there is an audience and then calms down and becomes irrate. Pt was informed that the internist has been contacted and will come and see her. Pt at times can be very demanding and uncooperative. Instructed she could not sit in the hall and cry she would need to go to her room . Pt given another warm compress -Pt denies any problem with bowel movements at this time.

## 2012-10-12 MED ORDER — LITHIUM CARBONATE 300 MG PO CAPS: 300.0000 mg | ORAL_CAPSULE | Freq: Two times a day (BID) | ORAL | Status: DC

## 2012-10-12 MED ORDER — NORGESTIMATE-ETH ESTRADIOL 0.25-35 MG-MCG PO TABS: 1.0000 | ORAL_TABLET | Freq: Every day | ORAL | Status: DC

## 2012-10-12 MED ORDER — PANTOPRAZOLE SODIUM 40 MG PO TBEC
40.0000 mg | DELAYED_RELEASE_TABLET | Freq: Two times a day (BID) | ORAL | Status: DC
  Filled 2012-10-12 (×2): qty 1

## 2012-10-12 MED ORDER — PANTOPRAZOLE SODIUM 40 MG PO TBEC: 40.0000 mg | DELAYED_RELEASE_TABLET | Freq: Two times a day (BID) | ORAL | Status: DC

## 2012-10-12 MED ORDER — GABAPENTIN 100 MG PO CAPS: 200.0000 mg | ORAL_CAPSULE | Freq: Every day | ORAL | Status: DC

## 2012-10-12 MED ORDER — ADULT MULTIVITAMIN W/MINERALS CH: 1.0000 | ORAL_TABLET | Freq: Every day | ORAL | Status: DC

## 2012-10-12 NOTE — [facility] Suicide Risk Assessment (Signed)
[facility] INPATIENT:  Family/Significant Other Suicide Prevention Education  Suicide Prevention Education:  Education Completed;Elizabeth Storholt-Brown, sister, [phone],  has been identified by the patient as the family member/significant other with whom the patient will be residing, and identified as the person(s) who will aid the patient in the event of a mental health crisis (suicidal ideations/suicide attempt).  With written consent from the patient, the family member/significant other has been provided the following suicide prevention education, prior to the and/or following the discharge of the patient.  The suicide prevention education provided includes the following:  Suicide risk factors  Suicide prevention and interventions  National Suicide Hotline telephone number  [facility] assessment telephone number  [facility] Emergency Assistance 911  [facility] and/or Residential Mobile Crisis Unit telephone number  Request made of family/significant other to:  Remove weapons (e.g., guns, rifles, knives), all items previously/currently identified as safety concern.    Remove drugs/medications (over-the-counter, prescriptions, illicit drugs), all items previously/currently identified as a safety concern.  The family member/significant other verbalizes understanding of the suicide prevention education information provided.  The family member/significant other agrees to remove the items of safety concern listed above.  Daryel Gerald B 10/12/2012, 11:26 AM

## 2012-10-12 NOTE — Progress Notes (Signed)
[facility] Adult Case Management Discharge Plan :  Will you be returning to the same living situation after discharge: Yes,  home At discharge, do you have transportation home?:Yes,  family Do you have the ability to pay for your medications:Yes,  Mental health  Release of information consent forms completed and in the chart;  Patient's signature needed at discharge.  Patient to Follow up at: Follow-up Information   Follow up with Anice Paganini On 10/14/2012. (Appt. at 8AM. Referral #: [id number])    Contact information:   [address] [place], [place] [address] Phone: [phone] Fax: [phone]      Patient denies SI/HI:   Yes,  yes    Safety Planning and Suicide Prevention discussed:  Yes,  yes  Ida Rogue 10/12/2012, 11:27 AM

## 2012-10-12 NOTE — B[facility] Group Notes (Signed)
[facility] LCSW Aftercare Discharge Planning Group Note   10/12/2012 8:45 AM  Participation Quality:  Appropriate  Mood/Affect:  Appropriate  Depression Rating:  0  Anxiety Rating:  1  Thoughts of Suicide:  None  Will you contract for safety? NA  Current AVH: Negative  Plan for Discharge/Comments:  Patient plans to obtain a 50 B when discharged, follow up with therapy and Mental Health area supports  Transportation Means: Father  Supports: Family, friends  Shelby, Julious Payer

## 2012-10-12 NOTE — Progress Notes (Signed)
Patient did attend the evening speaker AA meeting.  
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
## 2012-10-12 NOTE — Progress Notes (Signed)
Patient denies SI/HI, denies A/V hallucinations. Patient verbalizes understanding of discharge instructions, follow up care and prescriptions. Patient given all belongings from [facility] locker. Patient escorted out by staff, transported by family. 

## 2012-10-12 NOTE — Progress Notes (Signed)
Adult Psychoeducational Group Note  Date:  10/12/2012 Time:  1:31 PM  Group Topic/Focus:  Self Care:   The focus of this group is to help patients understand the importance of self-care in order to improve or restore emotional, physical, spiritual, interpersonal, and financial health.  Participation Level:  Active  Participation Quality:  Appropriate, Attentive and Sharing  Affect:  Appropriate  Cognitive:  Appropriate  Insight: Appropriate  Engagement in Group:  Engaged  Modes of Intervention:  Discussion  Additional Comments:  Pt was appropriate and attentive while attending group. Pt shared that self-care meant to have good personal hygiene. Pt also stated that she does well at taking care of her pets and wants to do better at calling and checking on relatives.   Sharyn Lull 10/12/2012, 1:31 PM
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
## 2012-10-12 NOTE — Progress Notes (Signed)
Adult Psychoeducational Group Note  Date:  10/12/2012 Time:  11:02 AM  Group Topic/Focus:  Coping With Mental Health Crisis:   The purpose of this group is to help patients identify strategies for coping with mental health crisis.  Group discusses possible causes of crisis and ways to manage them effectively.  Participation Level:  Active  Participation Quality:  Attentive  Affect:  Appropriate  Cognitive:  Alert  Insight: Good  Engagement in Group:  Distracting  Modes of Intervention:  Activity and Discussion  Additional Comments:  PT is here for medication adjustment and is working on Pharmacologist for anger.  Orvin Netter T 10/12/2012, 11:02 AM

## 2012-10-12 NOTE — [facility] Suicide Risk Assessment (Signed)
Suicide Risk Assessment  Discharge Assessment     Demographic Factors:  Caucasian  Mental Status Per Nursing Assessment::   On Admission:  Self-harm thoughts;Thoughts of violence towards others  Current Mental Status by Physician: In full contact with reality. There are no suicidal ideas, plans or intent. Her mood is euthymic, her affect is appropriate. She is going to go home, meet with her family, she is going to decide what to do with work. She feels much better since she was diagnosed with bipolar disorder and medicated appropriately   Loss Factors: NA  Historical Factors: NA  Risk Reduction Factors:   Sense of responsibility to family, Employed, Living with another person, especially a relative and Positive social support  Continued Clinical Symptoms:  Bipolar Disorder:   Bipolar II  Cognitive Features That Contribute To Risk: No evidence   Suicide Risk:  Minimal: No identifiable suicidal ideation.  Patients presenting with no risk factors but with morbid ruminations; may be classified as minimal risk based on the severity of the depressive symptoms  Discharge Diagnoses:   AXIS I:  Bipolar Affective Disorder, Anxiety Disorder NOS AXIS II:  Deferred AXIS III:   Past Medical History  Diagnosis Date  . Anxiety   . Celiac disease   . Depression   . Arthritis   . ADHD (attention deficit hyperactivity disorder)   . Allergy   . Ulcer   . Stroke    AXIS IV:  other psychosocial or environmental problems AXIS V:  61-70 mild symptoms  Plan Of Care/Follow-up recommendations:  Activity:  as tolerated Diet:  regular Follow up outpatient basis Is patient on multiple antipsychotic therapies at discharge:  No   Has Patient had three or more failed trials of antipsychotic monotherapy by history:  No  Recommended Plan for Multiple Antipsychotic Therapies: N/A   Shelby Bennett A 10/12/2012, 3:47 PM

## 2012-10-15 NOTE — Progress Notes (Signed)
Patient Discharge Instructions:  After Visit Summary (AVS):   Faxed to:  10/15/12 Discharge Summary Note:   Faxed to:  10/15/12 Psychiatric Admission Assessment Note:   Faxed to:  10/15/12 Suicide Risk Assessment - Discharge Assessment:   Faxed to:  10/15/12 Faxed/Sent to the Next Level Care provider:  10/15/12 Faxed to [facility] @ [phone]  Jerelene Redden, 10/15/2012, 3:24 PM

## 2012-11-01 ENCOUNTER — Emergency Department
Admission: EM | Admit: 2012-11-01 | Discharge: 2012-11-01 | Disposition: A | Discharge status: Home / Self Care | Payer: Self-pay | Source: Home / Self Care | Attending: Emergency Medicine | Admitting: Emergency Medicine

## 2012-11-01 ENCOUNTER — Emergency Department (INDEPENDENT_AMBULATORY_CARE_PROVIDER_SITE_OTHER): Payer: Self-pay

## 2012-11-01 ENCOUNTER — Encounter: Payer: Self-pay | Admitting: Emergency Medicine

## 2012-11-01 DIAGNOSIS — R0602 Shortness of breath: Secondary | ICD-10-CM

## 2012-11-01 DIAGNOSIS — R062 Wheezing: Secondary | ICD-10-CM

## 2012-11-01 DIAGNOSIS — J069 Acute upper respiratory infection, unspecified: Secondary | ICD-10-CM

## 2012-11-01 MED ORDER — IPRATROPIUM-ALBUTEROL 0.5-2.5 (3) MG/3ML IN SOLN: 3.0000 mL | Freq: Four times a day (QID) | RESPIRATORY_TRACT | Status: DC | PRN

## 2012-11-01 MED ORDER — AMOXICILLIN 875 MG PO TABS: 875.0000 mg | ORAL_TABLET | Freq: Two times a day (BID) | ORAL | Status: DC

## 2012-11-01 MED ORDER — FLUCONAZOLE 150 MG PO TABS: 150.0000 mg | ORAL_TABLET | Freq: Once | ORAL | Status: DC

## 2012-11-01 MED ORDER — METHYLPREDNISOLONE SODIUM SUCC 125 MG IJ SOLR
125.0000 mg | Freq: Once | INTRAMUSCULAR | Status: AC
  Administered 2012-11-01: 125 mg via INTRAMUSCULAR
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
## 2012-11-01 NOTE — ED Notes (Signed)
In distress with coughing and wheezing; producing yellowish sputum. States 5 day hx URI.

## 2012-11-03 ENCOUNTER — Telehealth: Payer: Self-pay | Admitting: *Deleted

## 2012-11-03 MED ORDER — DOXYCYCLINE HYCLATE 100 MG PO CAPS: 100.0000 mg | ORAL_CAPSULE | Freq: Two times a day (BID) | ORAL | Status: DC

## 2012-11-03 MED ORDER — PREDNISONE 50 MG PO TABS: 50.0000 mg | ORAL_TABLET | Freq: Every day | ORAL | Status: DC

## 2012-11-03 MED ORDER — ALBUTEROL SULFATE HFA 108 (90 BASE) MCG/ACT IN AERS: 1.0000 | INHALATION_SPRAY | Freq: Four times a day (QID) | RESPIRATORY_TRACT | Status: DC | PRN

## 2012-12-10 ENCOUNTER — Ambulatory Visit (INDEPENDENT_AMBULATORY_CARE_PROVIDER_SITE_OTHER): Payer: Self-pay | Admitting: Family Medicine

## 2012-12-10 VITALS — BP 105/70 | HR 93 | Temp 97.1°F | Resp 16 | Ht 69.5 in | Wt 140.8 lb

## 2012-12-10 DIAGNOSIS — Z5181 Encounter for therapeutic drug level monitoring: Secondary | ICD-10-CM

## 2012-12-10 DIAGNOSIS — Z113 Encounter for screening for infections with a predominantly sexual mode of transmission: Secondary | ICD-10-CM

## 2012-12-10 DIAGNOSIS — Z124 Encounter for screening for malignant neoplasm of cervix: Secondary | ICD-10-CM

## 2012-12-10 NOTE — Patient Instructions (Addendum)
I will be in touch with your labs- nice to see you today!

## 2012-12-10 NOTE — Progress Notes (Signed)
[facility] [address], [place] [address] [id number]  Date:  12/10/2012   Name:  Shelby Bennett   DOB:  11-24-1990   MRN:  [id number]  PCP:  JEFFERY,CHELLE, PA-C    Chief Complaint: Medication Management and Gynecologic Exam   History of Present Illness:  Shelby Bennett is a 22 y.o. very pleasant female patient who presents with the following:  She needs a pap and STI tests today.  She is taking lithium per her psychiatrist and needs a level drawn today. She just started lithium a few months ago Her last pap was normal- she has never had an abnormal pap.  Last pap was done elsewhere.    LMP ended about 10 days ago She has no other symptoms or complaints today She is on OCP  Patient Active Problem List   Diagnosis Date Noted  . Bipolar affective disorder, current episode manic 10/10/2012    Class: Acute  . Anxiety and depression 04/14/2012  . ADHD (attention deficit hyperactivity disorder) 04/14/2012    Past Medical History  Diagnosis Date  . Anxiety   . Celiac disease   . Depression   . Arthritis   . ADHD (attention deficit hyperactivity disorder)   . Allergy   . Ulcer   . Stroke     Past Surgical History  Procedure Laterality Date  . Appendectomy      History  Substance Use Topics  . Smoking status: Current Every Day Smoker -- 0.70 packs/day for 7 years    Types: Cigarettes    Last Attempt to Quit: 08/29/2011  . Smokeless tobacco: Never Used  . Alcohol Use: No     Comment: rarely    Family History  Problem Relation Age of Onset  . Mental illness Sister     PMDD  . Cervical cancer Sister     s/p ablation  . Mental illness Brother     depression  . Mental illness Brother     depression  . Mental illness Mother     bipolar  . Migraines Father     Allergies  Allergen Reactions  . 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 Zithromax (Azithromycin Dihydrate) Diarrhea and Nausea And  Vomiting  . Latex Swelling  . Equetro (Carbamazepine Er)     Suicidal rash  . Lavender Oil     Medication list has been reviewed and updated.  Current Outpatient Prescriptions on File Prior to Visit  Medication Sig Dispense Refill  . lithium carbonate 300 MG capsule Take 1 capsule (300 mg total) by mouth 2 (two) times daily with a meal.  60 capsule  0  . norgestimate-ethinyl estradiol (ORTHO-CYCLEN,SPRINTEC,PREVIFEM) 0.25-35 MG-MCG tablet Take 1 tablet by mouth daily.  1 Package  6  . albuterol (PROVENTIL HFA;VENTOLIN HFA) 108 (90 BASE) MCG/ACT inhaler Inhale 1-2 puffs into the lungs every 6 (six) hours as needed for wheezing.  1 Inhaler  0  . amoxicillin (AMOXIL) 875 MG tablet Take 1 tablet (875 mg total) by mouth 2 (two) times daily.  14 tablet  0  . doxycycline (VIBRAMYCIN) 100 MG capsule Take 1 capsule (100 mg total) by mouth 2 (two) times daily.  14 capsule  0  . fluconazole (DIFLUCAN) 150 MG tablet Take 1 tablet (150 mg total) by mouth once. May repeat in 3 days  2 tablet  0  . gabapentin (NEURONTIN) 100 MG capsule Take 2 capsules (200 mg total)  by mouth at bedtime.  60 capsule  0  . ibuprofen (ADVIL,MOTRIN) 200 MG tablet Take 400-800 mg by mouth See admin instructions. 400 mg as needed for headache, 800 mg as needed for migraine      . ipratropium-albuterol (DUONEB) 0.5-2.5 (3) MG/3ML SOLN Take 3 mLs by nebulization every 6 (six) hours as needed.  360 mL  0  . Multiple Vitamin (MULTIVITAMIN WITH MINERALS) TABS Take 1 tablet by mouth daily.  30 tablet  0  . pantoprazole (PROTONIX) 40 MG tablet Take 1 tablet (40 mg total) by mouth 2 (two) times daily.  60 tablet  0  . predniSONE (DELTASONE) 50 MG tablet Take 1 tablet (50 mg total) by mouth daily.  5 tablet  0   No current facility-administered medications on file prior to visit.    Review of Systems:  As per HPI- otherwise negative.   Physical Examination: Filed Vitals:   12/10/12 1027  BP: 96/72  Pulse: 93  Temp: 97.1 F  (36.2 C)  Resp: 16   Filed Vitals:   12/10/12 1027  Height: 5' 9.5" (1.765 m)  Weight: 140 lb 12.8 oz (63.866 kg)   Body mass index is 20.5 kg/(m^2). Ideal Body Weight: Weight in (lb) to have BMI = 25: 171.4  GEN: WDWN, NAD, Non-toxic, A & O x 3, looks well HEENT: Atraumatic, Normocephalic. Neck supple. No masses, No LAD. Ears and Nose: No external deformity. CV: RRR, No M/G/R. No JVD. No thrill. No extra heart sounds. PULM: CTA B, no wheezes, crackles, rhonchi. No retractions. No resp. distress. No accessory muscle use. ABD: S, NT, ND EXTR: No c/c/e NEURO Normal gait.  PSYCH: Normally interactive. Conversant. Not depressed or anxious appearing.  Calm demeanor.  GU: normal exam, no CMT, no adnexal masses or discharge.  No inflammation   Assessment and Plan: Screening for cervical cancer - Plan: Pap IG, CT/NG w/ reflex HPV when ASC-U  Routine screening for STI (sexually transmitted infection) - Plan: HIV antibody, RPR, HSV(herpes simplex vrs) 1+2 ab-IgG, Hepatitis B surface antibody, Hepatitis C antibody  Medication monitoring encounter - Plan: Lithium level  Await pap and other labs- will contact her with results.   Please call pt with results- ok to [facility].   Signed Abbe Amsterdam, MD

## 2012-12-11 LAB — HSV(HERPES SIMPLEX VRS) I + II AB-IGG
HSV 1 Glycoprotein G Ab, IgG: 0.11 IV
HSV 2 Glycoprotein G Ab, IgG: 9.78 IV — ABNORMAL HIGH

## 2012-12-11 LAB — LITHIUM LEVEL: Lithium Lvl: 0.6 mEq/L — ABNORMAL LOW (ref 0.80–1.40)

## 2012-12-14 ENCOUNTER — Encounter: Payer: Self-pay | Admitting: Family Medicine

## 2012-12-14 LAB — PAP IG, CT-NG, RFX HPV ASCU: GC Probe Amp: NEGATIVE

## 2012-12-15 ENCOUNTER — Telehealth: Payer: Self-pay | Admitting: Family Medicine

## 2012-12-15 DIAGNOSIS — IMO0002 Reserved for concepts with insufficient information to code with codable children: Secondary | ICD-10-CM

## 2012-12-15 DIAGNOSIS — A6000 Herpesviral infection of urogenital system, unspecified: Secondary | ICD-10-CM

## 2012-12-15 NOTE — Telephone Encounter (Signed)
Called but no answer.

## 2012-12-16 ENCOUNTER — Encounter: Payer: Self-pay | Admitting: Family Medicine

## 2012-12-16 MED ORDER — VALACYCLOVIR HCL 1 G PO TABS: 1000.0000 mg | ORAL_TABLET | Freq: Every day | ORAL | Status: DC

## 2012-12-16 NOTE — Telephone Encounter (Signed)
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   Will refer her to OBG for colpo Start suppressive therapy with valtrex per her request.  Discussed need to avoid sexual contact if she has any sores (she is not aware of any past outbreaks, but has had some lesions on her buttocks in the past which may have been HSV).  Encouraged condom use.

## 2012-12-29 ENCOUNTER — Telehealth: Payer: Self-pay

## 2012-12-29 MED ORDER — ACYCLOVIR 200 MG PO CAPS: 400.0000 mg | ORAL_CAPSULE | Freq: Two times a day (BID) | ORAL | Status: DC

## 2012-12-29 NOTE — Telephone Encounter (Signed)
Have sent acyclovir to pharmacy, the 200mg  pills are on the $4 list.  She will need to take 2 pills twice daily for suppression

## 2012-12-29 NOTE — Telephone Encounter (Signed)
PT STATES THE VALTREX SHE WAS PRESCRIBED COST OVER $300.00 AND SHE CAN'T AFFORD THAT. WOULD LIKE TO HAVE SOMETHING ELSE CALLED IN CHEAPER PLEASE CALL [phone]   [facility] [address]

## 2013-05-04 ENCOUNTER — Ambulatory Visit: Payer: Self-pay | Admitting: Family Medicine

## 2013-05-04 VITALS — BP 90/70 | HR 71 | Temp 98.3°F | Resp 18 | Wt 136.0 lb

## 2013-05-04 DIAGNOSIS — R112 Nausea with vomiting, unspecified: Secondary | ICD-10-CM

## 2013-05-04 DIAGNOSIS — R55 Syncope and collapse: Secondary | ICD-10-CM

## 2013-05-04 DIAGNOSIS — R197 Diarrhea, unspecified: Secondary | ICD-10-CM

## 2013-05-04 DIAGNOSIS — R319 Hematuria, unspecified: Secondary | ICD-10-CM

## 2013-05-04 DIAGNOSIS — R351 Nocturia: Secondary | ICD-10-CM

## 2013-05-04 DIAGNOSIS — R109 Unspecified abdominal pain: Secondary | ICD-10-CM

## 2013-05-04 LAB — POCT UA - MICROSCOPIC ONLY
Crystals, Ur, HPF, POC: NEGATIVE
Yeast, UA: NEGATIVE

## 2013-05-04 LAB — POCT CBC
HCT, POC: 48.1 % — AB (ref 37.7–47.9)
Lymph, poc: 2.5 (ref 0.6–3.4)
MCHC: 32.4 g/dL (ref 31.8–35.4)
MCV: 97.9 fL — AB (ref 80–97)
MID (cbc): 0.5 (ref 0–0.9)
POC Granulocyte: 4 (ref 2–6.9)
POC LYMPH PERCENT: 35.4 %L (ref 10–50)
Platelet Count, POC: 212 10*3/uL (ref 142–424)
RDW, POC: 12.6 %

## 2013-05-04 LAB — POCT URINALYSIS DIPSTICK
Blood, UA: NEGATIVE
Nitrite, UA: NEGATIVE
Protein, UA: NEGATIVE
Spec Grav, UA: >=1.03
Urobilinogen, UA: 0.2
pH, UA: 6

## 2013-05-04 LAB — POCT URINE PREGNANCY: Preg Test, Ur: NEGATIVE

## 2013-05-04 LAB — GLUCOSE, POCT (MANUAL RESULT ENTRY): POC Glucose: 84 mg/dl (ref 70–99)

## 2013-05-04 MED ORDER — NITROFURANTOIN MONOHYD MACRO 100 MG PO CAPS: 100.0000 mg | ORAL_CAPSULE | Freq: Two times a day (BID) | ORAL | Status: DC

## 2013-05-04 MED ORDER — ONDANSETRON 4 MG PO TBDP: 4.0000 mg | ORAL_TABLET | Freq: Three times a day (TID) | ORAL | Status: DC | PRN

## 2013-05-04 MED ORDER — ONDANSETRON 4 MG PO TBDP
4.0000 mg | ORAL_TABLET | Freq: Once | ORAL | Status: AC
  Administered 2013-05-04: 4 mg via ORAL

## 2013-05-04 NOTE — Progress Notes (Signed)
Subjective:    Patient ID: Shelby Bennett, female    DOB: 21-Nov-1990, 22 y.o.   MRN: [id number] This chart was scribed for Shelby Staggers, MD by Danella Maiers, ED Scribe. This patient was seen in room 9 and the patient's care was started at 8:16 PM.  Chief Complaint  Patient presents with  . Abdominal Pain  . Diarrhea  . Loss of Consciousness    all of this has been going on x 1 month  . Emesis   HPI HPI Comments: Shelby Bennett is a 22 y.o. female with a h/o anxiety, depression, bipolar affective disorder, and ADHD who presents complaining of lower abdominal pain, diarrhea, and emesis for the past month. She reports constant nausea and states she can only eat 1/3 of what she wants. She drinks two gulps of peptobismol when she gets to 3 days of no eating so that she can eat. This happens 2x per month. She has had peptobismol 4-5 times in the last week. She reports up to 2 episodes emesis per day. She has diarrhea anywhere from 1-5 times depending on amt of food consumed. She denies blood in diarrhea or emesis. She has been getting her menstrual period every 2 weeks for the past 2 months. She has not seen her OBGYN because she states she cannot afford to see her. She has never been pregnant and states this is not possible.  She has bilateral lower abdominal pain and LUQ described as cramping. She has been dry heaving all day today. Her highest recorded home temp was two weeks ago, 101.7, but no known fevers since. She has been blacking out, everything going dark, and she loses consciousness. The first time was 3-4 weeks ago in the bath tub. The most recent time was four days ago after standing up from squatting doing laundry. She did not actually lose consciousness this most recent time, but vision went dark. She has never had these symptoms prior to one month ago. no hx of seizures.  Her sister had diarrhea without nausea vomiting. She has not taken antibiotics recently, no recent  hospitalizations.  She took herself off of Lithium one month ago. She did not tell her doctor. She has not had any SI. She has anxiety but states it is baseline for her. She is seeing Dr. Don Broach, next appt is 11/6. She denies intentional weight loss and calorie restriction. She smokes 1/3 ppd and one "bowl" marijuana per night. She denies alcohol use, and no other illicit drug use.   Patient Active Problem List   Diagnosis Date Noted  . Bipolar affective disorder, current episode manic 10/10/2012    Class: Acute  . Anxiety and depression 04/14/2012  . ADHD (attention deficit hyperactivity disorder) 04/14/2012   Past Medical History  Diagnosis Date  . Anxiety   . Celiac disease   . Depression   . Arthritis   . ADHD (attention deficit hyperactivity disorder)   . Allergy   . Ulcer   . Stroke   . Asthma    Past Surgical History  Procedure Laterality Date  . Appendectomy     Allergies  Allergen Reactions  . 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 Zithromax [Azithromycin Dihydrate] Diarrhea and Nausea And Vomiting  . Latex Swelling  . Shelby Bennett Er]     Suicidal rash  . Lavender Oil    Prior to Admission medications   Medication Sig Start Date End Date Taking? Authorizing Provider  acyclovir (ZOVIRAX) 200 MG capsule Take 2 capsules (400 mg total) by mouth 2 (two) times daily. 12/29/12   Eleanore Delia Chimes, PA-C  albuterol (PROVENTIL HFA;VENTOLIN HFA) 108 (90 BASE) MCG/ACT inhaler Inhale 1-2 puffs into the lungs every 6 (six) hours as needed for wheezing. 11/03/12   Doree Albee, MD  clonazePAM (KLONOPIN) 1 MG tablet Take 1 mg by mouth at bedtime and may repeat dose one time if needed.    Historical Provider, MD  ipratropium-albuterol (DUONEB) 0.5-2.5 (3) MG/3ML SOLN Take 3 mLs by nebulization every 6 (six) hours as needed. 11/01/12   Marlaine Hind, MD  lithium carbonate 300 MG capsule Take 1 capsule (300 mg total) by mouth 2 (two) times daily  with a meal. 10/12/12   Nanine Means, NP  Multiple Vitamin (MULTIVITAMIN WITH MINERALS) TABS Take 1 tablet by mouth daily. 10/12/12   Nanine Means, NP  norgestimate-ethinyl estradiol (ORTHO-CYCLEN,SPRINTEC,PREVIFEM) 0.25-35 MG-MCG tablet Take 1 tablet by mouth daily. 10/12/12   Nanine Means, NP   History  Substance Use Topics  . Smoking status: Current Every Day Smoker -- 0.70 packs/day for 7 years    Types: Cigarettes    Last Attempt to Quit: 08/29/2011  . Smokeless tobacco: Never Used  . Alcohol Use: No     Comment: rarely     Review of Systems  Constitutional: Negative for fever.  Gastrointestinal: Positive for nausea, vomiting, abdominal pain and diarrhea. Negative for blood in stool.  Genitourinary: Negative for dysuria, hematuria and decreased urine volume.  Skin: Positive for rash.  Neurological: Positive for syncope.  Psychiatric/Behavioral: Negative for suicidal ideas and confusion. The patient is nervous/anxious.        Objective:   Physical Exam  Nursing note and vitals reviewed. Constitutional: She is oriented to person, place, and time. She appears well-developed and well-nourished. No distress.  HENT:  Head: Normocephalic and atraumatic.  Mouth/Throat: Mucous membranes are normal.  Eyes: EOM are normal. Pupils are equal, round, and reactive to light.  Equal pallettte elevation., moist mucosa.  Neck: Neck supple. No tracheal deviation present.  Cardiovascular: Normal rate and regular rhythm.  Exam reveals no gallop and no friction rub.   No murmur heard. Pulmonary/Chest: Effort normal. No respiratory distress. She has no wheezes. She has no rales.  Abdominal: She exhibits no distension. There is tenderness. There is no rebound and no guarding.  Hyperactive bowel sounds, LUQ and RLQ and LLQ tender, RUQ non-tender, negative murpheys. No CVA tenderness. Non-distended, no rebound, no guarding.  Musculoskeletal: Normal range of motion.  Neurological: She is alert and  oriented to person, place, and time. She has normal strength. She displays no tremor. She displays a negative Romberg sign. She displays no seizure activity. Coordination and gait normal.  No pronator drift. Normal finger to nose.  Skin: Skin is warm and dry.  Psychiatric: She has a normal mood and affect. Her speech is normal and behavior is normal. Thought content normal. She expresses no suicidal ideation. She expresses no suicidal plans.    Filed Vitals:   05/04/13 1919  BP: 85/70  Pulse: 70  Temp: 98.3 F (36.8 C)  TempSrc: Oral  Resp: 18  Weight: 136 lb (61.689 kg)  SpO2: 98%   Results for orders placed in visit on 05/04/13  POCT CBC      Result Value Range   WBC 7.0  4.6 - 10.2 K/uL   Lymph, poc 2.5  0.6 - 3.4   POC LYMPH PERCENT 35.4  10 -  50 %L   MID (cbc) 0.5  0 - 0.9   POC MID % 6.8  0 - 12 %M   POC Granulocyte 4.0  2 - 6.9   Granulocyte percent 57.8  37 - 80 %G   RBC 4.91  4.04 - 5.48 M/uL   Hemoglobin 15.6  12.2 - 16.2 g/dL   HCT, POC 16.1 (*) 09.6 - 47.9 %   MCV 97.9 (*) 80 - 97 fL   MCH, POC 31.8 (*) 27 - 31.2 pg   MCHC 32.4  31.8 - 35.4 g/dL   RDW, POC 04.5     Platelet Count, POC 212  142 - 424 K/uL   MPV 11.1  0 - 99.8 fL  POCT UA - MICROSCOPIC ONLY      Result Value Range   WBC, Ur, HPF, POC 2-4     RBC, urine, microscopic 3-10     Bacteria, U Microscopic 3+     Mucus, UA pos     Epithelial cells, urine per micros 6-7     Crystals, Ur, HPF, POC neg     Casts, Ur, LPF, POC neg     Yeast, UA neg    POCT URINALYSIS DIPSTICK      Result Value Range   Color, UA yellow     Clarity, UA clear     Glucose, UA neg     Bilirubin, UA neg     Ketones, UA 15     Spec Grav, UA >=1.030     Blood, UA neg     pH, UA 6.0     Protein, UA neg     Urobilinogen, UA 0.2     Nitrite, UA neg     Leukocytes, UA Negative    POCT URINE PREGNANCY      Result Value Range   Preg Test, Ur Negative    GLUCOSE, POCT (MANUAL RESULT ENTRY)      Result Value Range   POC  Glucose 84  70 - 99 mg/dl   Filed Vitals:   40/98/11 1919 05/04/13 2036 05/04/13 2037 05/04/13 2038  BP: 85/70 98/58 98/60  90/70  Pulse: 70 52 61 71  Temp: 98.3 F (36.8 C)     TempSrc: Oral     Resp: 18     Weight: 136 lb (61.689 kg)     SpO2: 98%          Assessment & Plan:   SALEEMAH MOLLENHAUER is a 22 y.o. female Nausea with vomiting - Plan: POCT CBC, POCT urine pregnancy, Comprehensive metabolic panel, Lipase Abdominal pain, unspecified site - Plan: POCT CBC, POCT UA - Microscopic Only, POCT urinalysis dipstick, POCT urine pregnancy, Comprehensive metabolic panel, Lipase, POCT glucose (manual entry) Syncope and collapse - Plan: POCT CBC, POCT glucose (manual entry) Nocturia - Plan: POCT UA - Microscopic Only, POCT urinalysis dipstick, POCT urine pregnancy, POCT glucose (manual entry) Diarrhea  1 month history of n/v, diarrhea, and cramping abdominal pain. S/p appendectomy when younger, and negative murphy's - doubt gallbladder but CMP pending. Prior fever, but afebrile recently. Reassuring CBC, but lipase and CMP pending. Can try zofran for nausea and to allow po fluids as suspect element of volume depletion with 2 prior vagal episodes/syncope and concentrated urine tonight. .  Due to length and persistence of symptoms, will refer to GI to help in deciding imaging or other testing - urgent referral, but if any worsening, including any fever, worsening abdominal pain or inability to maintain hydration, to  return here or ER. Out of work note given for next 2 days.   Nocturia, with slight hematuria, few wbc's - check urine cx, macrobid 100mg  BID for now, but if new side effects can stop and wait on cx. as concentrated urine. Recommended recheck U/a when better hydrated. Creatinine pending on CMP.   Syncope - near syncope most recently with volume depletion. Not orthostatic. Push fluids as above.   Hx of Bipolar affective d/o - off lithium, denies recent SI - states feels better off  lithium. Plan for follow up with psychiatric provider next week. ER precautions discussed if SI returns.   I personally performed the services described in this documentation, which was scribed in my presence. The recorded information has been reviewed and considered, and addended by me as needed.   Meds ordered this encounter  Medications  . ondansetron (ZOFRAN ODT) 4 MG disintegrating tablet    Sig: Take 1 tablet (4 mg total) by mouth every 8 (eight) hours as needed for nausea.    Dispense:  10 tablet    Refill:  0  . ondansetron (ZOFRAN-ODT) disintegrating tablet 4 mg    Sig:   . nitrofurantoin, macrocrystal-monohydrate, (MACROBID) 100 MG capsule    Sig: Take 1 capsule (100 mg total) by mouth 2 (two) times daily.    Dispense:  14 capsule    Refill:  0   Patient Instructions  zofran for nausea, macrobid for red and white blood cells in the urine for possible infection. You should receive a call or letter about your lab results within the next week to 10 days. Increase fluid intake. We wil refer you to a gastroenterologist, but if any worsening of symptoms prior - return here or to the emergency room. Recheck urine test when better hydrated and after antibiotic. If you have new side effects with antibiotic, can stop until culture returns, but call and let us know if you do this.  Keep follow up with your psychiatrist as planned.  Return to the clinic or go to the nearest emergency room if any of your symptoms worsen or new symptoms occur. Diarrhea Diarrhea is frequent loose and watery bowel movements. It can cause you to feel weak and dehydrated. Dehydration can cause you to become tired and thirsty, have a dry mouth, and have decreased urination that often is dark yellow. Diarrhea is a sign of another problem, most often an infection that will not last long. In most cases, diarrhea typically lasts 2 3 days. However, it can last longer if it is a sign of something more serious. It is important  to treat your diarrhea as directed by your caregive to lessen or prevent future episodes of diarrhea. CAUSES  Some common causes include:  Gastrointestinal infections caused by viruses, bacteria, or parasites.  Food poisoning or food allergies.  Certain medicines, such as antibiotics, chemotherapy, and laxatives.  Artificial sweeteners and fructose.  Digestive disorders. HOME CARE INSTRUCTIONS  Ensure adequate fluid intake (hydration): have 1 cup (8 oz) of fluid for each diarrhea episode. Avoid fluids that contain simple sugars or sports drinks, fruit juices, whole milk products, and sodas. Your urine should be clear or pale yellow if you are drinking enough fluids. Hydrate with an oral rehydration solution that you can purchase at pharmacies, retail stores, and online. You can prepare an oral rehydration solution at home by mixing the following ingredients together:    tsp table salt.   tsp baking soda.   tsp salt substitute containing  potassium chloride.  1  tablespoons sugar.  1 L (34 oz) of water.  Certain foods and beverages may increase the speed at which food moves through the gastrointestinal (GI) tract. These foods and beverages should be avoided and include:  Caffeinated and alcoholic beverages.  High-fiber foods, such as raw fruits and vegetables, nuts, seeds, and whole grain breads and cereals.  Foods and beverages sweetened with sugar alcohols, such as xylitol, sorbitol, and mannitol.  Some foods may be well tolerated and may help thicken stool including:  Starchy foods, such as rice, toast, pasta, low-sugar cereal, oatmeal, grits, baked potatoes, crackers, and bagels.  Bananas.  Applesauce.  Add probiotic-rich foods to help increase healthy bacteria in the GI tract, such as yogurt and fermented milk products.  Wash your hands well after each diarrhea episode.  Only take over-the-counter or prescription medicines as directed by your caregiver.  Take a  warm bath to relieve any burning or pain from frequent diarrhea episodes. SEEK IMMEDIATE MEDICAL CARE IF:   You are unable to keep fluids down.  You have persistent vomiting.  You have blood in your stool, or your stools are black and tarry.  You do not urinate in 6 8 hours, or there is only a small amount of very dark urine.  You have abdominal pain that increases or localizes.  You have weakness, dizziness, confusion, or lightheadedness.  You have a severe headache.  Your diarrhea gets worse or does not get better.  You have a fever or persistent symptoms for more than 2 3 days.  You have a fever and your symptoms suddenly get worse. MAKE SURE YOU:   Understand these instructions.  Will watch your condition.  Will get help right away if you are not doing well or get worse. Document Released: 06/14/2002 Document Revised: 06/10/2012 Document Reviewed: 03/01/2012 [facility] Patient Information 2014 [place], [place].   Nausea and Vomiting Nausea is a sick feeling that often comes before throwing up (vomiting). Vomiting is a reflex where stomach contents come out of your mouth. Vomiting can cause severe loss of body fluids (dehydration). Children and elderly adults can become dehydrated quickly, especially if they also have diarrhea. Nausea and vomiting are symptoms of a condition or disease. It is important to find the cause of your symptoms. CAUSES   Direct irritation of the stomach lining. This irritation can result from increased acid production (gastroesophageal reflux disease), infection, food poisoning, taking certain medicines (such as nonsteroidal anti-inflammatory drugs), alcohol use, or tobacco use.  Signals from the brain.These signals could be caused by a headache, heat exposure, an inner ear disturbance, increased pressure in the brain from injury, infection, a tumor, or a concussion, pain, emotional stimulus, or metabolic problems.  An obstruction in the  gastrointestinal tract (bowel obstruction).  Illnesses such as diabetes, hepatitis, gallbladder problems, appendicitis, kidney problems, cancer, sepsis, atypical symptoms of a heart attack, or eating disorders.  Medical treatments such as chemotherapy and radiation.  Receiving medicine that makes you sleep (general anesthetic) during surgery. DIAGNOSIS Your caregiver may ask for tests to be done if the problems do not improve after a few days. Tests may also be done if symptoms are severe or if the reason for the nausea and vomiting is not clear. Tests may include:  Urine tests.  Blood tests.  Stool tests.  Cultures (to look for evidence of infection).  X-rays or other imaging studies. Test results can help your caregiver make decisions about treatment or the need for additional  tests. TREATMENT You need to stay well hydrated. Drink frequently but in small amounts.You may wish to drink water, sports drinks, clear broth, or eat frozen ice pops or gelatin dessert to help stay hydrated.When you eat, eating slowly may help prevent nausea.There are also some antinausea medicines that may help prevent nausea. HOME CARE INSTRUCTIONS   Take all medicine as directed by your caregiver.  If you do not have an appetite, do not force yourself to eat. However, you must continue to drink fluids.  If you have an appetite, eat a normal diet unless your caregiver tells you differently.  Eat a variety of complex carbohydrates (rice, wheat, potatoes, bread), lean meats, yogurt, fruits, and vegetables.  Avoid high-fat foods because they are more difficult to digest.  Drink enough water and fluids to keep your urine clear or pale yellow.  If you are dehydrated, ask your caregiver for specific rehydration instructions. Signs of dehydration may include:  Severe thirst.  Dry lips and mouth.  Dizziness.  Dark urine.  Decreasing urine frequency and amount.  Confusion.  Rapid breathing or  pulse. SEEK IMMEDIATE MEDICAL CARE IF:   You have blood or brown flecks (like coffee grounds) in your vomit.  You have black or bloody stools.  You have a severe headache or stiff neck.  You are confused.  You have severe abdominal pain.  You have chest pain or trouble breathing.  You do not urinate at least once every 8 hours.  You develop cold or clammy skin.  You continue to vomit for longer than 24 to 48 hours.  You have a fever. MAKE SURE YOU:   Understand these instructions.  Will watch your condition.  Will get help right away if you are not doing well or get worse. Document Released: 06/24/2005 Document Revised: 09/16/2011 Document Reviewed: 11/21/2010 [facility] Patient Information 2014 [place], [place].

## 2013-05-04 NOTE — Patient Instructions (Signed)
zofran for nausea, macrobid for red and white blood cells in the urine for possible infection. You should receive a call or letter about your lab results within the next week to 10 days. Increase fluid intake. We wil refer you to a gastroenterologist, but if any worsening of symptoms prior - return here or to the emergency room. Recheck urine test when better hydrated and after antibiotic. If you have new side effects with antibiotic, can stop until culture returns, but call and let us know if you do this.  Keep follow up with your psychiatrist as planned.  Return to the clinic or go to the nearest emergency room if any of your symptoms worsen or new symptoms occur. Diarrhea Diarrhea is frequent loose and watery bowel movements. It can cause you to feel weak and dehydrated. Dehydration can cause you to become tired and thirsty, have a dry mouth, and have decreased urination that often is dark yellow. Diarrhea is a sign of another problem, most often an infection that will not last long. In most cases, diarrhea typically lasts 2 3 days. However, it can last longer if it is a sign of something more serious. It is important to treat your diarrhea as directed by your caregive to lessen or prevent future episodes of diarrhea. CAUSES  Some common causes include:  Gastrointestinal infections caused by viruses, bacteria, or parasites.  Food poisoning or food allergies.  Certain medicines, such as antibiotics, chemotherapy, and laxatives.  Artificial sweeteners and fructose.  Digestive disorders. HOME CARE INSTRUCTIONS  Ensure adequate fluid intake (hydration): have 1 cup (8 oz) of fluid for each diarrhea episode. Avoid fluids that contain simple sugars or sports drinks, fruit juices, whole milk products, and sodas. Your urine should be clear or pale yellow if you are drinking enough fluids. Hydrate with an oral rehydration solution that you can purchase at pharmacies, retail stores, and online. You can  prepare an oral rehydration solution at home by mixing the following ingredients together:    tsp table salt.   tsp baking soda.   tsp salt substitute containing potassium chloride.  1  tablespoons sugar.  1 L (34 oz) of water.  Certain foods and beverages may increase the speed at which food moves through the gastrointestinal (GI) tract. These foods and beverages should be avoided and include:  Caffeinated and alcoholic beverages.  High-fiber foods, such as raw fruits and vegetables, nuts, seeds, and whole grain breads and cereals.  Foods and beverages sweetened with sugar alcohols, such as xylitol, sorbitol, and mannitol.  Some foods may be well tolerated and may help thicken stool including:  Starchy foods, such as rice, toast, pasta, low-sugar cereal, oatmeal, grits, baked potatoes, crackers, and bagels.  Bananas.  Applesauce.  Add probiotic-rich foods to help increase healthy bacteria in the GI tract, such as yogurt and fermented milk products.  Wash your hands well after each diarrhea episode.  Only take over-the-counter or prescription medicines as directed by your caregiver.  Take a warm bath to relieve any burning or pain from frequent diarrhea episodes. SEEK IMMEDIATE MEDICAL CARE IF:   You are unable to keep fluids down.  You have persistent vomiting.  You have blood in your stool, or your stools are black and tarry.  You do not urinate in 6 8 hours, or there is only a small amount of very dark urine.  You have abdominal pain that increases or localizes.  You have weakness, dizziness, confusion, or lightheadedness.  You have a severe  headache.  Your diarrhea gets worse or does not get better.  You have a fever or persistent symptoms for more than 2 3 days.  You have a fever and your symptoms suddenly get worse. MAKE SURE YOU:   Understand these instructions.  Will watch your condition.  Will get help right away if you are not doing well or get  worse. Document Released: 06/14/2002 Document Revised: 06/10/2012 Document Reviewed: 03/01/2012 [facility] Patient Information 2014 [place], [place].   Nausea and Vomiting Nausea is a sick feeling that often comes before throwing up (vomiting). Vomiting is a reflex where stomach contents come out of your mouth. Vomiting can cause severe loss of body fluids (dehydration). Children and elderly adults can become dehydrated quickly, especially if they also have diarrhea. Nausea and vomiting are symptoms of a condition or disease. It is important to find the cause of your symptoms. CAUSES   Direct irritation of the stomach lining. This irritation can result from increased acid production (gastroesophageal reflux disease), infection, food poisoning, taking certain medicines (such as nonsteroidal anti-inflammatory drugs), alcohol use, or tobacco use.  Signals from the brain.These signals could be caused by a headache, heat exposure, an inner ear disturbance, increased pressure in the brain from injury, infection, a tumor, or a concussion, pain, emotional stimulus, or metabolic problems.  An obstruction in the gastrointestinal tract (bowel obstruction).  Illnesses such as diabetes, hepatitis, gallbladder problems, appendicitis, kidney problems, cancer, sepsis, atypical symptoms of a heart attack, or eating disorders.  Medical treatments such as chemotherapy and radiation.  Receiving medicine that makes you sleep (general anesthetic) during surgery. DIAGNOSIS Your caregiver may ask for tests to be done if the problems do not improve after a few days. Tests may also be done if symptoms are severe or if the reason for the nausea and vomiting is not clear. Tests may include:  Urine tests.  Blood tests.  Stool tests.  Cultures (to look for evidence of infection).  X-rays or other imaging studies. Test results can help your caregiver make decisions about treatment or the need for additional  tests. TREATMENT You need to stay well hydrated. Drink frequently but in small amounts.You may wish to drink water, sports drinks, clear broth, or eat frozen ice pops or gelatin dessert to help stay hydrated.When you eat, eating slowly may help prevent nausea.There are also some antinausea medicines that may help prevent nausea. HOME CARE INSTRUCTIONS   Take all medicine as directed by your caregiver.  If you do not have an appetite, do not force yourself to eat. However, you must continue to drink fluids.  If you have an appetite, eat a normal diet unless your caregiver tells you differently.  Eat a variety of complex carbohydrates (rice, wheat, potatoes, bread), lean meats, yogurt, fruits, and vegetables.  Avoid high-fat foods because they are more difficult to digest.  Drink enough water and fluids to keep your urine clear or pale yellow.  If you are dehydrated, ask your caregiver for specific rehydration instructions. Signs of dehydration may include:  Severe thirst.  Dry lips and mouth.  Dizziness.  Dark urine.  Decreasing urine frequency and amount.  Confusion.  Rapid breathing or pulse. SEEK IMMEDIATE MEDICAL CARE IF:   You have blood or brown flecks (like coffee grounds) in your vomit.  You have black or bloody stools.  You have a severe headache or stiff neck.  You are confused.  You have severe abdominal pain.  You have chest pain or trouble breathing.  You do not urinate at least once every 8 hours.  You develop cold or clammy skin.  You continue to vomit for longer than 24 to 48 hours.  You have a fever. MAKE SURE YOU:   Understand these instructions.  Will watch your condition.  Will get help right away if you are not doing well or get worse. Document Released: 06/24/2005 Document Revised: 09/16/2011 Document Reviewed: 11/21/2010 [facility] Patient Information 2014 [place], [place].

## 2013-05-05 LAB — COMPREHENSIVE METABOLIC PANEL
ALT: 10 U/L (ref 0–35)
BUN: 12 mg/dL (ref 6–23)
CO2: 25 mEq/L (ref 19–32)
Creat: 0.73 mg/dL (ref 0.50–1.10)
Glucose, Bld: 82 mg/dL (ref 70–99)
Sodium: 138 mEq/L (ref 135–145)
Total Bilirubin: 0.9 mg/dL (ref 0.3–1.2)
Total Protein: 7.4 g/dL (ref 6.0–8.3)

## 2013-05-05 LAB — LIPASE: Lipase: 10 U/L (ref 0–75)

## 2013-05-06 ENCOUNTER — Encounter: Payer: Self-pay | Admitting: Internal Medicine

## 2013-05-06 LAB — URINE CULTURE
Colony Count: NO GROWTH
Organism ID, Bacteria: NO GROWTH

## 2013-06-10 ENCOUNTER — Other Ambulatory Visit: Payer: Self-pay

## 2013-06-10 ENCOUNTER — Encounter: Payer: Self-pay | Admitting: Internal Medicine

## 2013-06-10 ENCOUNTER — Ambulatory Visit (INDEPENDENT_AMBULATORY_CARE_PROVIDER_SITE_OTHER): Payer: Self-pay | Admitting: Internal Medicine

## 2013-06-10 VITALS — BP 100/70 | HR 64 | Ht 69.5 in | Wt 136.8 lb

## 2013-06-10 DIAGNOSIS — R109 Unspecified abdominal pain: Secondary | ICD-10-CM

## 2013-06-10 DIAGNOSIS — R112 Nausea with vomiting, unspecified: Secondary | ICD-10-CM

## 2013-06-10 DIAGNOSIS — R634 Abnormal weight loss: Secondary | ICD-10-CM

## 2013-06-10 DIAGNOSIS — R197 Diarrhea, unspecified: Secondary | ICD-10-CM

## 2013-06-10 MED ORDER — DICYCLOMINE HCL 10 MG PO CAPS: 10.0000 mg | ORAL_CAPSULE | Freq: Three times a day (TID) | ORAL | Status: DC

## 2013-06-10 NOTE — Patient Instructions (Addendum)
You have been given a separate informational sheet regarding your tobacco use, the importance of quitting and local resources to help you quit.  Your physician has requested that you go to the basement for the lab work before leaving today.  We have sent the following medications to your pharmacy for you to pick up at your convenience: Generic Bentyl, and you may purchase a over the counter acid reducer product and use as needed.  We will call you with results and plans.  I appreciate the opportunity to care for you.

## 2013-06-10 NOTE — Progress Notes (Signed)
Referred by: Shade Flood, MD  Subjective:    Patient ID: Shelby Bennett, female    DOB: [birth year], 22 y.o.   MRN: [id number]  HPI The patient is a single young white woman with a chronic history of nausea vomiting and diarrhea. She has a lot of crampy lower quadrant abdominal pain as well. This is been going on for about one year. She does not see bleeding, she has not been anemic. She has lost some weight, as well as her appetite. He belches frequently and has indigestion. There is no dysphagia. She believes she has celiac disease, self diagnosis, and says she is much better off gluten and is generally on a gluten-free diet. She was very sick in the fall and was at [facility], laboratories are reviewed and generally unrevealing. Other problems include bipolar disorder and mental illness, currently not on therapy. She is also having metromenorrhagia since discontinuing birth control pills about 6 months ago. She has never had workup for these problems that I can see. She did have appendicitis in 2006, CT scan from then reviewed. Allergies  Allergen Reactions  . 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 Zithromax [Azithromycin Dihydrate] Diarrhea and Nausea And Vomiting  . Latex Swelling  . Debby Freiberg Er]     Suicidal rash  . Lavender Oil    Outpatient Prescriptions Prior to Visit  Medication Sig Dispense Refill  . clonazePAM (KLONOPIN) 1 MG tablet Take 1 mg by mouth at bedtime and may repeat dose one time if needed.      Marland Kitchen acyclovir (ZOVIRAX) 200 MG capsule Take 2 capsules (400 mg total) by mouth 2 (two) times daily.  60 capsule  11  . albuterol (PROVENTIL HFA;VENTOLIN HFA) 108 (90 BASE) MCG/ACT inhaler Inhale 1-2 puffs into the lungs every 6 (six) hours as needed for wheezing.  1 Inhaler  0  . ipratropium-albuterol (DUONEB) 0.5-2.5 (3) MG/3ML SOLN Take 3 mLs by nebulization every 6 (six) hours as needed.  360 mL  0    . lithium carbonate 300 MG capsule Take 1 capsule (300 mg total) by mouth 2 (two) times daily with a meal.  60 capsule  0  . Multiple Vitamin (MULTIVITAMIN WITH MINERALS) TABS Take 1 tablet by mouth daily.  30 tablet  0  . nitrofurantoin, macrocrystal-monohydrate, (MACROBID) 100 MG capsule Take 1 capsule (100 mg total) by mouth 2 (two) times daily.  14 capsule  0  . norgestimate-ethinyl estradiol (ORTHO-CYCLEN,SPRINTEC,PREVIFEM) 0.25-35 MG-MCG tablet Take 1 tablet by mouth daily.  1 Package  6  . ondansetron (ZOFRAN ODT) 4 MG disintegrating tablet Take 1 tablet (4 mg total) by mouth every 8 (eight) hours as needed for nausea.  10 tablet  0   No facility-administered medications prior to visit.   Past Medical History  Diagnosis Date  . Anxiety   . Celiac disease     ? self diagnosis  . Bipolar disorder   . Arthritis   . ADHD (attention deficit hyperactivity disorder)   . Allergy   . 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 Asthma   . Mononucleosis 2012   Past Surgical History  Procedure Laterality Date  . Appendectomy     History   Social History  . Marital Status: Single    Spouse Name: n/a    Number of Children: 0  . Years of Education:  12+   Occupational History  . massage therapist     [employer]   Social History Main Topics  . Smoking status: Current Every Day Smoker -- 0.70 packs/day for 7 years    Types: Cigarettes    Last Attempt to Quit: 08/29/2011  . Smokeless tobacco: Never Used  . Alcohol Use: No     Comment: rarely  . Drug Use: No  . Sexual Activity: Yes    Birth Control/ Protection: None   Other Topics Concern  . 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   Family History  Problem Relation Age of Onset  . Mental illness Sister     PMDD  . Cervical cancer Sister     s/p ablation  . Mental illness Brother     depression  . Mental illness Brother     depression  . Mental illness Mother     bipolar  . Migraines  Father     Review of Systems This is positive for those things mentiones in the HPI, also positive for anxiety, depressed mood, night sweats, insomnia headaches. Has some menstrual pain as well with the metromenorrhagia. She is not active sexually in a heterosexual way. Says she cannot be pregnant.. All other review of systems are negative.      Objective:   Physical Exam General:  Well-developed, well-nourished and in no acute distress Eyes:  anicteric. ENT:   Mouth and posterior pharynx free of lesions.  Neck:   supple w/o thyromegaly or mass.  Lungs: Clear to auscultation bilaterally. Heart:  S1S2, no rubs, murmurs, gallops. Abdomen:  soft, non-tender, no hepatosplenomegaly, hernia, or mass and BS+.  Lymph:  no cervical or supraclavicular adenopathy. Extremities:   no edema, nails w/o lesions Skin   mild acne Neuro:  A&O x 3.  Psych:  appropriate mood and  Affect.   Data Reviewed: As per history of present illness Lab Results  Component Value Date   WBC 7.0 05/04/2013   HGB 15.6 05/04/2013   HCT 48.1* 05/04/2013   MCV 97.9* 05/04/2013   PLT 221 10/09/2012     Chemistry      Component Value Date/Time   NA 138 05/04/2013 2111   K 4.1 05/04/2013 2111   CL 103 05/04/2013 2111   CO2 25 05/04/2013 2111   BUN 12 05/04/2013 2111   CREATININE 0.73 05/04/2013 2111   CREATININE 0.67 10/09/2012 0620      Component Value Date/Time   CALCIUM 10.2 05/04/2013 2111   ALKPHOS 33* 05/04/2013 2111   AST 15 05/04/2013 2111   ALT 10 05/04/2013 2111   BILITOT 0.9 05/04/2013 2111     Lab Results  Component Value Date   LIPASE 10 05/04/2013   Wt Readings from Last 3 Encounters:  06/10/13 136 lb 12.8 oz (62.052 kg)  05/04/13 136 lb (61.689 kg)  12/10/12 140 lb 12.8 oz (63.866 kg)       Assessment & Plan:   1. Nausea and vomiting   2. Abdominal pain of multiple sites   3. Loss of weight   4. Diarrhea    She has a constellation of chronic multiple GI complaints. It is in the  setting of untreated mental illness. She think she has celiac disease, that is possible. If she were gluten-free then that should be adequately treated though she could potentially have refractory celiac disease. IBS is a strong possibility here. Inflammatory bowel disease is also possible. She  had gynecologic symptoms as well.  1. Will evaluate with immunoglobulins and tissue transglutaminase antibody. She claims frequent infections so want to see what her immunoglobulin levels are as well as check for celiac disease. I explained that if her celiac antibody was negative it did not exclude that since she's been on a gluten-free diet. 2. Dicyclomine 10 mg before meals and at bedtime 3. Omeprazole or lansoprazole over-the-counter on a daily basis for indigestion and GERD-like symptoms. 4. She could need cross-sectional imaging with CT scanning, endoscopic evaluation pending results of the labs and response to treatment as above. We will call with results and arrange followup.  I appreciate the opportunity to care for this patient. CC: [name], MD

## 2013-06-11 LAB — IGG, IGA, IGM: IgG (Immunoglobin G), Serum: 893 mg/dL (ref 690–1700)

## 2013-06-11 LAB — TISSUE TRANSGLUTAMINASE, IGA: Tissue Transglutaminase Ab, IgA: 3 U/mL (ref <20)

## 2013-06-16 NOTE — Progress Notes (Signed)
Quick Note:  Let her know testing for celiac disease is negative and immunoglobulin levels are ok (immunogloulins involved in immunity and fighting infection)  Ask how she is with dicyclomine and PPI   ______

## 2013-11-07 ENCOUNTER — Ambulatory Visit: Payer: Self-pay | Admitting: Physician Assistant

## 2013-11-07 VITALS — BP 96/54 | HR 77 | Temp 98.0°F | Resp 16 | Ht 68.75 in | Wt 149.0 lb

## 2013-11-07 DIAGNOSIS — J069 Acute upper respiratory infection, unspecified: Secondary | ICD-10-CM

## 2013-11-07 DIAGNOSIS — J060 Acute laryngopharyngitis: Secondary | ICD-10-CM

## 2013-11-07 DIAGNOSIS — R062 Wheezing: Secondary | ICD-10-CM

## 2013-11-07 DIAGNOSIS — B9789 Other viral agents as the cause of diseases classified elsewhere: Principal | ICD-10-CM

## 2013-11-07 MED ORDER — HYDROCODONE-HOMATROPINE 5-1.5 MG/5ML PO SYRP: 5.0000 mL | ORAL_SOLUTION | Freq: Three times a day (TID) | ORAL | Status: DC | PRN

## 2013-11-07 MED ORDER — HYDROCOD POLST-CHLORPHEN POLST 10-8 MG/5ML PO LQCR: 5.0000 mL | Freq: Two times a day (BID) | ORAL | Status: AC

## 2013-11-07 MED ORDER — ALBUTEROL SULFATE HFA 108 (90 BASE) MCG/ACT IN AERS: 2.0000 | INHALATION_SPRAY | Freq: Four times a day (QID) | RESPIRATORY_TRACT | Status: DC | PRN

## 2013-11-07 MED ORDER — TRIAMCINOLONE ACETONIDE 55 MCG/ACT NA AERO: 2.0000 | INHALATION_SPRAY | Freq: Every day | NASAL | Status: DC

## 2013-11-07 MED ORDER — GUAIFENESIN ER 1200 MG PO TB12: 1.0000 | ORAL_TABLET | Freq: Two times a day (BID) | ORAL | Status: AC

## 2013-11-07 NOTE — Patient Instructions (Signed)
Sudafed at night for dripping but only once a day we do not want to get the mucus thick which will increase your chances of getting a bacteria sinus infection. Afrin at night to help with feeling like you cannot breath through your nose.  Please only use this for about 3 days - using it longer can increase your congestion. Tylenol/motrin for the body aches and feeling poorly. Push fluids.

## 2013-11-07 NOTE — Progress Notes (Signed)
   Subjective:    Patient ID: Shelby Bennett, female    DOB: January 19, 1991, 23 y.o.   MRN: [id number]  HPI Pt presented to clinic with 5 day h/o cold symptoms with sinus congestion with yellow rhinorrhea and then last pm she developed a cough that is only productive in the am.  She has yellow rhinorrhea all day.  She has been using a netti-pot but seems to feel congested soon after she uses it.  She has a sore throat all the time from the PND.    OTC meds - Cold preps, sudaefed, daily Zyrtec  Sick contacts - people at work sick  Review of Systems  Constitutional: Positive for fever (subjective fever) and chills.  HENT: Positive for congestion, postnasal drip and rhinorrhea (yellow).   Respiratory: Positive for cough (yellow in the am). Negative for shortness of breath and wheezing.        Reactive airway history that needed an albuterol inhaler last spring.  Trying to quit smoking - using E-Cig  Gastrointestinal: Negative.   Neurological: Negative for dizziness and headaches.       Objective:   Physical Exam  Vitals reviewed. Constitutional: She is oriented to person, place, and time. She appears well-developed and well-nourished.  HENT:  Head: Normocephalic and atraumatic.  Right Ear: Hearing, tympanic membrane, external ear and ear canal normal.  Left Ear: Hearing, tympanic membrane, external ear and ear canal normal.  Nose: Mucosal edema (red) present.  Mouth/Throat: Uvula is midline, oropharynx is clear and moist and mucous membranes are normal.  Eyes: Conjunctivae are normal.  Neck: Neck supple.  Cardiovascular: Normal rate, regular rhythm and normal heart sounds.   No murmur heard. Pulmonary/Chest: Effort normal. She has wheezes.  Lymphadenopathy:    She has cervical adenopathy.  Neurological: She is alert and oriented to person, place, and time.  Skin: Skin is warm and dry.  Psychiatric: She has a normal mood and affect. Her behavior is normal. Judgment and thought  content normal.      Assessment & Plan:  Viral URI with cough - Plan: triamcinolone (NASACORT AQ) 55 MCG/ACT AERO nasal inhaler, Guaifenesin (MUCINEX MAXIMUM STRENGTH) 1200 MG TB12, HYDROcodone-homatropine (HYCODAN) 5-1.5 MG/5ML syrup  Wheezing - Plan: albuterol (PROVENTIL HFA;VENTOLIN HFA) 108 (90 BASE) MCG/ACT inhaler  Symptomatic treatment discussed.  She will contact me if she is not improved in 1 week.  Benny LennertSarah Weber PA-C  [facility] 11/07/2013 9:24 AM

## 2014-01-22 ENCOUNTER — Emergency Department (HOSPITAL_COMMUNITY)
Admission: EM | Admit: 2014-01-22 | Discharge: 2014-01-22 | Disposition: A | Discharge status: Home / Self Care | Payer: Self-pay | Attending: Emergency Medicine | Admitting: Emergency Medicine

## 2014-01-22 ENCOUNTER — Encounter (HOSPITAL_COMMUNITY): Payer: Self-pay | Admitting: Emergency Medicine

## 2014-01-22 ENCOUNTER — Emergency Department (HOSPITAL_COMMUNITY): Payer: Self-pay

## 2014-01-22 DIAGNOSIS — K529 Noninfective gastroenteritis and colitis, unspecified: Secondary | ICD-10-CM

## 2014-01-22 DIAGNOSIS — F17200 Nicotine dependence, unspecified, uncomplicated: Secondary | ICD-10-CM | POA: Insufficient documentation

## 2014-01-22 DIAGNOSIS — Z9089 Acquired absence of other organs: Secondary | ICD-10-CM | POA: Insufficient documentation

## 2014-01-22 DIAGNOSIS — Z8673 Personal history of transient ischemic attack (TIA), and cerebral infarction without residual deficits: Secondary | ICD-10-CM | POA: Insufficient documentation

## 2014-01-22 DIAGNOSIS — F319 Bipolar disorder, unspecified: Secondary | ICD-10-CM | POA: Insufficient documentation

## 2014-01-22 DIAGNOSIS — M129 Arthropathy, unspecified: Secondary | ICD-10-CM | POA: Insufficient documentation

## 2014-01-22 DIAGNOSIS — F411 Generalized anxiety disorder: Secondary | ICD-10-CM | POA: Insufficient documentation

## 2014-01-22 DIAGNOSIS — J45909 Unspecified asthma, uncomplicated: Secondary | ICD-10-CM | POA: Insufficient documentation

## 2014-01-22 DIAGNOSIS — Z3202 Encounter for pregnancy test, result negative: Secondary | ICD-10-CM | POA: Insufficient documentation

## 2014-01-22 DIAGNOSIS — Z8619 Personal history of other infectious and parasitic diseases: Secondary | ICD-10-CM | POA: Insufficient documentation

## 2014-01-22 DIAGNOSIS — K5289 Other specified noninfective gastroenteritis and colitis: Secondary | ICD-10-CM | POA: Insufficient documentation

## 2014-01-22 DIAGNOSIS — Z91040 Latex allergy status: Secondary | ICD-10-CM | POA: Insufficient documentation

## 2014-01-22 DIAGNOSIS — R1032 Left lower quadrant pain: Secondary | ICD-10-CM | POA: Insufficient documentation

## 2014-01-22 LAB — CBC WITH DIFFERENTIAL/PLATELET
BASOS PCT: 0 % (ref 0–1)
Basophils Absolute: 0 10*3/uL (ref 0.0–0.1)
EOS ABS: 0 10*3/uL (ref 0.0–0.7)
EOS PCT: 0 % (ref 0–5)
HCT: 43.4 % (ref 36.0–46.0)
HEMOGLOBIN: 15.2 g/dL — AB (ref 12.0–15.0)
LYMPHS ABS: 0.9 10*3/uL (ref 0.7–4.0)
Lymphocytes Relative: 12 % (ref 12–46)
MCH: 31.5 pg (ref 26.0–34.0)
MCHC: 35 g/dL (ref 30.0–36.0)
MCV: 89.9 fL (ref 78.0–100.0)
MONO ABS: 0.6 10*3/uL (ref 0.1–1.0)
MONOS PCT: 8 % (ref 3–12)
Neutro Abs: 6.1 10*3/uL (ref 1.7–7.7)
Neutrophils Relative %: 80 % — ABNORMAL HIGH (ref 43–77)
Platelets: 182 10*3/uL (ref 150–400)
RBC: 4.83 MIL/uL (ref 3.87–5.11)
RDW: 12.2 % (ref 11.5–15.5)
WBC: 7.6 10*3/uL (ref 4.0–10.5)

## 2014-01-22 LAB — COMPREHENSIVE METABOLIC PANEL
ALBUMIN: 4.5 g/dL (ref 3.5–5.2)
ALT: 10 U/L (ref 0–35)
ANION GAP: 14 (ref 5–15)
AST: 18 U/L (ref 0–37)
Alkaline Phosphatase: 63 U/L (ref 39–117)
BUN: 9 mg/dL (ref 6–23)
CALCIUM: 9.8 mg/dL (ref 8.4–10.5)
CO2: 23 mEq/L (ref 19–32)
CREATININE: 0.69 mg/dL (ref 0.50–1.10)
Chloride: 101 mEq/L (ref 96–112)
GFR calc non Af Amer: >90 mL/min (ref >90)
GLUCOSE: 95 mg/dL (ref 70–99)
Potassium: 4 mEq/L (ref 3.7–5.3)
Sodium: 138 mEq/L (ref 137–147)
TOTAL PROTEIN: 8 g/dL (ref 6.0–8.3)
Total Bilirubin: 0.3 mg/dL (ref 0.3–1.2)

## 2014-01-22 LAB — URINALYSIS, ROUTINE W REFLEX MICROSCOPIC
Bilirubin Urine: NEGATIVE
Glucose, UA: NEGATIVE mg/dL
Hgb urine dipstick: NEGATIVE
Ketones, ur: NEGATIVE mg/dL
LEUKOCYTES UA: NEGATIVE
NITRITE: NEGATIVE
PH: 5.5 (ref 5.0–8.0)
Protein, ur: NEGATIVE mg/dL
SPECIFIC GRAVITY, URINE: 1.025 (ref 1.005–1.030)
Urobilinogen, UA: 0.2 mg/dL (ref 0.0–1.0)

## 2014-01-22 LAB — POC URINE PREG, ED: Preg Test, Ur: NEGATIVE

## 2014-01-22 LAB — POC OCCULT BLOOD, ED: FECAL OCCULT BLD: POSITIVE — AB

## 2014-01-22 LAB — CLOSTRIDIUM DIFFICILE BY PCR: CDIFFPCR: NEGATIVE

## 2014-01-22 MED ORDER — MORPHINE SULFATE 4 MG/ML IJ SOLN
4.0000 mg | Freq: Once | INTRAMUSCULAR | Status: AC
  Administered 2014-01-22: 4 mg via INTRAVENOUS
  Filled 2014-01-22: qty 1

## 2014-01-22 MED ORDER — FLUCONAZOLE 150 MG PO TABS: 150.0000 mg | ORAL_TABLET | Freq: Every day | ORAL | Status: AC

## 2014-01-22 MED ORDER — IOHEXOL 300 MG/ML  SOLN
100.0000 mL | Freq: Once | INTRAMUSCULAR | Status: AC | PRN
  Administered 2014-01-22: 100 mL via INTRAVENOUS

## 2014-01-22 MED ORDER — HYDROCODONE-ACETAMINOPHEN 5-325 MG PO TABS: 1.0000 | ORAL_TABLET | ORAL | Status: DC | PRN

## 2014-01-22 MED ORDER — PROMETHAZINE HCL 12.5 MG PO TABS: 12.5000 mg | ORAL_TABLET | Freq: Four times a day (QID) | ORAL | Status: DC | PRN

## 2014-01-22 MED ORDER — METRONIDAZOLE 500 MG PO TABS: 500.0000 mg | ORAL_TABLET | Freq: Three times a day (TID) | ORAL | Status: DC

## 2014-01-22 MED ORDER — SODIUM CHLORIDE 0.9 % IV BOLUS (SEPSIS)
1000.0000 mL | Freq: Once | INTRAVENOUS | Status: AC
  Administered 2014-01-22: 1000 mL via INTRAVENOUS

## 2014-01-22 MED ORDER — ONDANSETRON HCL 4 MG/2ML IJ SOLN
4.0000 mg | Freq: Once | INTRAMUSCULAR | Status: AC
  Administered 2014-01-22: 4 mg via INTRAVENOUS
  Filled 2014-01-22: qty 2

## 2014-01-22 MED ORDER — METRONIDAZOLE 500 MG PO TABS
500.0000 mg | ORAL_TABLET | Freq: Once | ORAL | Status: AC
  Administered 2014-01-22: 500 mg via ORAL
  Filled 2014-01-22: qty 1

## 2014-01-22 MED ORDER — IOHEXOL 300 MG/ML  SOLN
50.0000 mL | Freq: Once | INTRAMUSCULAR | Status: AC | PRN
  Administered 2014-01-22: 50 mL via ORAL

## 2014-01-22 MED ORDER — CIPROFLOXACIN HCL 500 MG PO TABS: 500.0000 mg | ORAL_TABLET | Freq: Two times a day (BID) | ORAL | Status: DC

## 2014-01-22 MED ORDER — CIPROFLOXACIN HCL 500 MG PO TABS
500.0000 mg | ORAL_TABLET | Freq: Once | ORAL | Status: AC
  Administered 2014-01-22: 500 mg via ORAL
  Filled 2014-01-22: qty 1

## 2014-01-22 NOTE — ED Notes (Signed)
Per pt, diarrhea since Thursday with abdominal pain.  Pt cannot take anything in without having loose stool following.  Pt has also had headache/migraine.  Nausea with no vomiting.  Unknown fever.  Hx gastric celiac disease

## 2014-01-22 NOTE — Discharge Instructions (Signed)
Take cipro and flagyl as prescribed until all gone for the possible infection. Take norco for pain. Take phenergan for nausea. Follow up closely with Dr. Leone PayorGessner. Return if worsening symptoms.    Colitis Colitis is inflammation of the colon. Colitis can be a short-term or long-standing (chronic) illness. Crohn's disease and ulcerative colitis are 2 types of colitis which are chronic. They usually require lifelong treatment. CAUSES  There are many different causes of colitis, including:  Viruses.  Germs (bacteria).  Medicine reactions. SYMPTOMS   Diarrhea.  Intestinal bleeding.  Pain.  Fever.  Throwing up (vomiting).  Tiredness (fatigue).  Weight loss.  Bowel blockage. DIAGNOSIS  The diagnosis of colitis is based on examination and stool or blood tests. X-rays, CT scan, and colonoscopy may also be needed. TREATMENT  Treatment may include:  Fluids given through the vein (intravenously).  Bowel rest (nothing to eat or drink for a period of time).  Medicine for pain and diarrhea.  Medicines (antibiotics) that kill germs.  Cortisone medicines.  Surgery. HOME CARE INSTRUCTIONS   Get plenty of rest.  Drink enough water and fluids to keep your urine clear or pale yellow.  Eat a well-balanced diet.  Call your caregiver for follow-up as recommended. SEEK IMMEDIATE MEDICAL CARE IF:   You develop chills.  You have an oral temperature above 102 F (38.9 C), not controlled by medicine.  You have extreme weakness, fainting, or dehydration.  You have repeated vomiting.  You develop severe belly (abdominal) pain or are passing bloody or tarry stools. MAKE SURE YOU:   Understand these instructions.  Will watch your condition.  Will get help right away if you are not doing well or get worse. Document Released: 08/01/2004 Document Revised: 09/16/2011 Document Reviewed: 10/27/2009 [facility] Patient Information 2015 [place]. This information is not intended  to replace advice given to you by your health care provider. Make sure you discuss any questions you have with your health care provider.

## 2014-01-22 NOTE — ED Provider Notes (Signed)
CSN: [id number]     Arrival date & time 01/22/14  1135 History   First MD Initiated Contact with Patient 01/22/14 1219     Chief Complaint  Patient presents with  . Diarrhea  . Abdominal Pain     (Consider location/radiation/quality/duration/timing/severity/associated sxs/prior Treatment) HPI Shelby Bennett is a 23 y.o. female with history of possible celiac disease, who presents to emergency department complaining of diarrhea and abdominal pain. She states her diarrhea began approximately 4 days ago, worsening daily. She also reports left lower quadrant abdominal pain. She has been taking Pepto-Bismol, with no relief. She states she did notice her stool was pink. Patient states she is now going to the bathroom every half an hour, with liquid stools. 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 She denies any recent travel or antibiotics. She denies any similar abdominal symptoms. She denies any fever or chills. She admits to nausea, no vomiting. No other complaints.  Past Medical History  Diagnosis Date  . Anxiety   . Celiac disease     ? self diagnosis  . Bipolar disorder   . Arthritis   . ADHD (attention deficit hyperactivity disorder)   . Allergy   . 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 Asthma   . Mononucleosis 2012   Past Surgical History  Procedure Laterality Date  . Appendectomy     Family History  Problem Relation Age of Onset  . Mental illness Sister     PMDD  . Cervical cancer Sister     s/p ablation  . Mental illness Brother     depression  . Mental illness Brother     depression  . Mental illness Mother     bipolar  . Migraines Father    History  Substance Use Topics  . Smoking status: Current Every Day Smoker -- 0.70 packs/day for 7 years    Types: Cigarettes    Last Attempt to Quit: 08/29/2011  . Smokeless tobacco: Never Used  . Alcohol Use: No      Comment: rarely   OB History   Grav Para Term Preterm Abortions TAB SAB Ect Mult Living   0 0 0 0 0 0 0 0 0 0      Review of Systems  Constitutional: Negative for fever and chills.  Respiratory: Negative for cough, chest tightness and shortness of breath.   Cardiovascular: Negative for chest pain, palpitations and leg swelling.  Gastrointestinal: Positive for nausea, abdominal pain, diarrhea and blood in stool. Negative for vomiting.  Genitourinary: Negative for dysuria, flank pain, vaginal bleeding, vaginal discharge, vaginal pain and pelvic pain.  Musculoskeletal: Negative for arthralgias, myalgias, neck pain and neck stiffness.  Skin: Negative for rash.  Neurological: Negative for dizziness, weakness and headaches.  All other systems reviewed and are negative.     Allergies  Diclofenac sodium; Zithromax; Latex; Equetro; and Lavender oil  Home Medications   Prior to Admission medications   Medication Sig Start Date End Date Taking? Authorizing Provider  aspirin-acetaminophen-caffeine (EXCEDRIN EXTRA STRENGTH) [phone] MG per tablet Take 1 tablet by mouth every 6 (six) hours as needed for migraine.   Yes Historical Provider, MD  bismuth subsalicylate (PEPTO BISMOL) 262 MG/15ML suspension Take 30 mLs by mouth every 6 (six) hours as needed for indigestion or diarrhea or loose stools.   Yes Historical Provider, MD  gabapentin (  NEURONTIN) 100 MG capsule Take 200 mg by mouth at bedtime as needed (sleep).   Yes Historical Provider, MD  hyoscyamine (ANASPAZ) 0.125 MG TBDP disintergrating tablet Place 0.125 mg under the tongue every 4 (four) hours as needed for cramping.   Yes Historical Provider, MD  ibuprofen (ADVIL,MOTRIN) 200 MG tablet Take 800 mg by mouth every 6 (six) hours as needed for moderate pain.   Yes Historical Provider, MD   BP 102/64  Pulse 73  Temp(Src) 98.7 F (37.1 C) (Oral)  Resp 18  SpO2 100%  LMP 01/08/2014 Physical Exam  Nursing note and vitals  reviewed. Constitutional: She is oriented to person, place, and time. She appears well-developed and well-nourished. No distress.  HENT:  Head: Normocephalic.  Oral mucosa dry  Eyes: Conjunctivae are normal.  Neck: Neck supple.  Cardiovascular: Normal rate, regular rhythm and normal heart sounds.   Pulmonary/Chest: Effort normal and breath sounds normal. No respiratory distress. She has no wheezes. She has no rales.  Abdominal: Soft. Bowel sounds are normal. She exhibits no distension. There is tenderness. There is no rebound.  Left lower abdominal tenderness, mild guarding  Musculoskeletal: She exhibits no edema.  Neurological: She is alert and oriented to person, place, and time.  Skin: Skin is warm and dry.  Psychiatric: She has a normal mood and affect. Her behavior is normal.    ED Course  Procedures (including critical care time) Labs Review Labs Reviewed  CBC WITH DIFFERENTIAL - Abnormal; Notable for the following:    Hemoglobin 15.2 (*)    Neutrophils Relative % 80 (*)    All other components within normal limits  URINALYSIS, ROUTINE W REFLEX MICROSCOPIC - Abnormal; Notable for the following:    APPearance CLOUDY (*)    All other components within normal limits  POC OCCULT BLOOD, ED - Abnormal; Notable for the following:    Fecal Occult Bld POSITIVE (*)    All other components within normal limits  CLOSTRIDIUM DIFFICILE BY PCR  STOOL CULTURE  COMPREHENSIVE METABOLIC PANEL  POC URINE PREG, ED    Imaging Review Ct Abdomen Pelvis W Contrast  01/22/2014   CLINICAL DATA:  Abdominal pain, diarrhea.  EXAM: CT ABDOMEN AND PELVIS WITH CONTRAST  TECHNIQUE: Multidetector CT imaging of the abdomen and pelvis was performed using the standard protocol following bolus administration of intravenous contrast.  CONTRAST:  50mL OMNIPAQUE IOHEXOL 300 MG/ML SOLN, OMNIPAQUE IOHEXOL 300 MG/ML SOLN  COMPARISON:  CT scan of June 30, 2005.  FINDINGS: Visualized lung bases appear  normal. No osseous abnormality is noted.  No gallstones are noted. The liver, spleen and pancreas appear normal. Adrenal glands and kidneys appear normal. No hydronephrosis or renal obstruction is noted. Patient is status post appendectomy. Mild wall and fold thickening is seen throughout the colon suggesting mild colitis. No abnormal fluid collection is noted urinary bladder appears normal. 1.9 cm left ovarian cyst is noted. No significant adenopathy is noted.  IMPRESSION: Mild wall and fold thickening is seen throughout the colon, particularly in the transverse and descending portions. This is concerning for possible inflammatory or infectious colitis.   Electronically Signed   By: Roque Lias M.D.   On: 01/22/2014 14:43     EKG Interpretation None      MDM   Final diagnoses:  Colitis   Patient with watery diarrhea, left lower quadrant abdominal pain, some guarding present on exam. Patient is afebrile. Possible blood in stool, will get Hemoccult. Labs and urinalysis pending. We'll start  IV fluids, antiemetics, pain medications, will get CT abdomen and pelvis to evaluate for possible colitis. Stool cultures obtained with c diff. Pt is concerned bc of her job, states many people she comes in contact with travel.    3:15 PM CT with colon wall thickening. Pt with recurrent abdominal issues. She has seen Dr. Delle ReiningGuessner in the office about 6 months ago. Pt tested for possible celiac disease at that time, antibody negative, however pt has been strictly on gluten free diet and states she has been doing well. I called and spoke with Dr. Arlyce DiceKaplan who is on call For Dr. Delle ReiningGuessner, recommended obtaining stool cultures, cdiff, home with cipro and flagyl, follow up with them in the office. PT agreed to the plan first doses of antibiotics in ED, home   3:41 PM Pt was only able to give enough stool for c-diff. No cultures obtained. Tolerating POs. Stable for d/c home  Filed Vitals:   01/22/14 1153 01/22/14 1442   BP: 102/64 104/60  Pulse: 73 63  Temp: 98.7 F (37.1 C)   TempSrc: Oral   Resp: 18 16  SpO2: 100% 100%     Lottie Musselatyana A Cuma Polyakov, PA-C 01/22/14 1542

## 2014-01-22 NOTE — ED Notes (Signed)
MD at bedside. 

## 2014-01-23 NOTE — ED Provider Notes (Signed)
Medical screening examination/treatment/procedure(s) were performed by non-physician practitioner and as supervising physician I was immediately available for consultation/collaboration.    Tanyon Alipio L Ariah Mower, MD 01/23/14 0707 

## 2014-04-11 ENCOUNTER — Encounter: Payer: Self-pay | Admitting: Internal Medicine

## 2014-04-11 ENCOUNTER — Ambulatory Visit (INDEPENDENT_AMBULATORY_CARE_PROVIDER_SITE_OTHER): Payer: Self-pay | Admitting: Internal Medicine

## 2014-04-11 VITALS — BP 100/60 | HR 72 | Ht 69.5 in | Wt 156.2 lb

## 2014-04-11 DIAGNOSIS — R933 Abnormal findings on diagnostic imaging of other parts of digestive tract: Secondary | ICD-10-CM

## 2014-04-11 DIAGNOSIS — R197 Diarrhea, unspecified: Secondary | ICD-10-CM

## 2014-04-11 DIAGNOSIS — R195 Other fecal abnormalities: Secondary | ICD-10-CM

## 2014-04-11 NOTE — Patient Instructions (Signed)
You have been scheduled for a colonoscopy. Please follow written instructions given to you at your visit today.  Please pick up your prep supplies at the pharmacy. If you use inhalers (even only as needed), please bring them with you on the day of your procedure. Your physician has requested that you go to www.startemmi.com and enter the access code given to you at your visit today. This web site gives a general overview about your procedure. However, you should still follow specific instructions given to you by our office regarding your preparation for the procedure.   I appreciate the opportunity to care for you.  

## 2014-04-11 NOTE — Progress Notes (Signed)
   Subjective:    Patient ID: Shelby Bennett, female    DOB: 1990/09/05, 23 y.o.   MRN: [id number]  HPI The patient is here because of recurrent diarrhea and abdominal pain that led to an ED visit this summer. CT scan showed thickened colonic mucosa. She has gone to a "Crohn's diet" - limiting seeds and nuts and blending foods and feels better. Stool was heme + in ED also. No visible bleeding. Has had years of intermittent sxs - I saw her in 2014 - celiac testing negative.  Medications, allergies, past medical history, past surgical history, family history and social history are reviewed and updated in the EMR.  Review of Systems As above    Objective:   Physical Exam General:  NAD Eyes:   anicteric Lungs:  clear Heart:  S1S2 no rubs, murmurs or gallops Abdomen:  soft and nontender, BS+ Ext:   no edema  Data Reviewed:   ED notes, CT scan, labs in EMR for 2015    Assessment & Plan:  Diarrhea - Plan: Ambulatory referral to Gastroenterology  Abnormal CT scan, colon - Plan: Ambulatory referral to Gastroenterology  Heme + stool - Plan: Ambulatory referral to Gastroenterology  1. Will evaluate with a colonoscopy to look for signs of IBD or microscopic colitios. The risks and benefits as well as alternatives of endoscopic procedure(s) have been discussed and reviewed. All questions answered. The patient agrees to proceed.   I appreciate the opportunity to care for you. Iva Booparl E. Beauford Lando, MD, Clementeen GrahamFACG

## 2014-06-01 ENCOUNTER — Telehealth: Payer: Self-pay | Admitting: Internal Medicine

## 2014-06-01 NOTE — Telephone Encounter (Signed)
Instructed pt to avoid raw veggies even blended for these 5 days. Told her she can do cooked veggies with no issues. She verbalized understanding.  Shelby Bennett PV

## 2014-06-06 ENCOUNTER — Encounter: Payer: Self-pay | Admitting: Internal Medicine

## 2014-06-06 ENCOUNTER — Ambulatory Visit (AMBULATORY_SURGERY_CENTER): Payer: Self-pay | Admitting: Internal Medicine

## 2014-06-06 VITALS — BP 103/51 | HR 52 | Temp 97.9°F | Resp 15 | Ht 69.5 in | Wt 156.0 lb

## 2014-06-06 DIAGNOSIS — R197 Diarrhea, unspecified: Secondary | ICD-10-CM

## 2014-06-06 MED ORDER — SODIUM CHLORIDE 0.9 % IV SOLN: 500.0000 mL | INTRAVENOUS | Status: DC

## 2014-06-06 NOTE — Patient Instructions (Addendum)
Things looked normal but I took biopses of the colon lining to look for microscopic inflammation. I am glad to hear things are better with diet modification.  I appreciate the opportunity to care for you. Iva Booparl E. Olean Sangster, MD, [facility] GrahamFACG'  YOU HAD AN ENDOSCOPIC PROCEDURE TODAY AT THE [facility]: Refer to the procedure report that was given to you for any specific questions about what was found during the examination.  If the procedure report does not answer your questions, please call your gastroenterologist to clarify.  If you requested that your care partner not be given the details of your procedure findings, then the procedure report has been included in a sealed envelope for you to review at your convenience later.  YOU SHOULD EXPECT: Some feelings of bloating in the abdomen. Passage of more gas than usual.  Walking can help get rid of the air that was put into your GI tract during the procedure and reduce the bloating. If you had a lower endoscopy (such as a colonoscopy or flexible sigmoidoscopy) you may notice spotting of blood in your stool or on the toilet paper. If you underwent a bowel prep for your procedure, then you may not have a normal bowel movement for a few days.  DIET: Your first meal following the procedure should be a light meal and then it is ok to progress to your normal diet.  A half-sandwich or bowl of soup is an example of a good first meal.  Heavy or fried foods are harder to digest and may make you feel nauseous or bloated.  Likewise meals heavy in dairy and vegetables can cause extra gas to form and this can also increase the bloating.  Drink plenty of fluids but you should avoid alcoholic beverages for 24 hours.  ACTIVITY: Your care partner should take you home directly after the procedure.  You should plan to take it easy, moving slowly for the rest of the day.  You can resume normal activity the day after the procedure however you should NOT DRIVE or use  heavy machinery for 24 hours (because of the sedation medicines used during the test).    SYMPTOMS TO REPORT IMMEDIATELY: A gastroenterologist can be reached at any hour.  During normal business hours, 8:30 AM to 5:00 PM Monday through Friday, call [phone].  After hours and on weekends, please call the GI answering service at [phone] who will take a message and have the physician on call contact you.   Following lower endoscopy (colonoscopy or flexible sigmoidoscopy):  Excessive amounts of blood in the stool  Significant tenderness or worsening of abdominal pains  Swelling of the abdomen that is new, acute  Fever of 100F or higher  FOLLOW UP: If any biopsies were taken you will be contacted by phone or by letter within the next 1-3 weeks.  Call your gastroenterologist if you have not heard about the biopsies in 3 weeks.  Our staff will call the home number listed on your records the next business day following your procedure to check on you and address any questions or concerns that you may have at that time regarding the information given to you following your procedure. This is a courtesy call and so if there is no answer at the home number and we have not heard from you through the emergency physician on call, we will assume that you have returned to your regular daily activities without incident.  SIGNATURES/CONFIDENTIALITY: You and/or your care partner  have signed paperwork which will be entered into your electronic medical record.  These signatures attest to the fact that that the information above on your After Visit Summary has been reviewed and is understood.  Full responsibility of the confidentiality of this discharge information lies with you and/or your care-partner.

## 2014-06-06 NOTE — Progress Notes (Signed)
Called to room to assist during endoscopic procedure.  Patient ID and intended procedure confirmed with present staff. Received instructions for my participation in the procedure from the performing physician.  

## 2014-06-06 NOTE — Progress Notes (Signed)
Stable to RR 

## 2014-06-06 NOTE — Op Note (Signed)
[facility] [address] [place], [id number]   COLONOSCOPY PROCEDURE REPORT  PATIENT: Shelby Shelby, Shelby Shelby  MR#: [id number] BIRTHDATE: October 16, 1990 , 23  yrs. old GENDER: female ENDOSCOPIST: Iva Booparl Bennett Kyrsten Deleeuw, MD, [facility] PROCEDURE DATE:  06/06/2014 PROCEDURE:   Colonoscopy with biopsy First Screening Colonoscopy - Avg.  risk and is 50 yrs.  old or older - No.  Prior Negative Screening - Now for repeat screening. N/A  History of Adenoma - Now for follow-up colonoscopy & has been > or = to 3 yrs.  N/A  Polyps Removed Today? No.  Polyps Removed Today? No.  Recommend repeat exam, <10 yrs? Polyps Removed Today? No.  Recommend repeat exam, <10 yrs? No. ASA CLASS:   Class II INDICATIONS:unexplained diarrhea and heme-positive stool. MEDICATIONS: Propofol 250 mg IV and Monitored anesthesia care  DESCRIPTION OF PROCEDURE:   After the risks benefits and alternatives of the procedure were thoroughly explained, informed consent was obtained.  The digital rectal exam revealed no abnormalities of the rectum.   The LB [id number]  endoscope was introduced through the anus and advanced to the terminal ileum which was intubated for a short distance. No adverse events experienced.   The quality of the prep was excellent, using MiraLax The instrument was then slowly withdrawn as the colon was fully examined.      COLON FINDINGS: The examined terminal ileum appeared to be normal. The colonic mucosa appeared normal throughout the entire examined colon.  Multiple random biopsies were performed.  Retroflexed views revealed no abnormalities. The time to cecum=3 minutes 50 seconds. Withdrawal time=8 minutes 09 seconds.  The scope was withdrawn and the procedure completed. COMPLICATIONS: There were no immediate complications.  ENDOSCOPIC IMPRESSION: 1.   The examined terminal ileum appeared to be normal 2.   The colonic mucosa appeared normal throughout the entire examined colon;  multiple random biopsies were performed  RECOMMENDATIONS: Await pathology results - will notify by mail She is doing well with diet modification at this time - I think she has IBS  eSigned:  Iva Booparl Bennett Orean Giarratano, MD, [facility] 06/06/2014 8:57 AM   cc: The Patient

## 2014-06-07 ENCOUNTER — Telehealth: Payer: Self-pay

## 2014-06-07 NOTE — Telephone Encounter (Signed)
  Follow up Call-  Call back number 06/06/2014  Post procedure Call Back phone  # [phone] cell  Permission to leave phone message Yes     Patient questions:  Do you have a fever, pain , or abdominal swelling? No. Pain Score  0 *  Have you tolerated food without any problems? Yes.    Have you been able to return to your normal activities? Yes.    Do you have any questions about your discharge instructions: Diet   No. Medications  No. Follow up visit  No.  Do you have questions or concerns about your Care? No.  Actions: * If pain score is 4 or above: No action needed, pain <4.

## 2014-06-09 NOTE — Progress Notes (Signed)
Quick Note:  NL colon bxs C/w IBS ______

## 2014-06-09 NOTE — Progress Notes (Signed)
Quick Note:  I notified the patient by My Chart message So no letter No recall ______

## 2015-06-28 ENCOUNTER — Emergency Department (HOSPITAL_COMMUNITY)
Admission: EM | Admit: 2015-06-28 | Discharge: 2015-06-28 | Disposition: A | Discharge status: Home / Self Care | Payer: Self-pay | Source: Home / Self Care | Attending: Emergency Medicine | Admitting: Emergency Medicine

## 2015-06-28 ENCOUNTER — Encounter (HOSPITAL_COMMUNITY): Payer: Self-pay | Admitting: Emergency Medicine

## 2015-06-28 DIAGNOSIS — J069 Acute upper respiratory infection, unspecified: Secondary | ICD-10-CM

## 2015-06-28 DIAGNOSIS — H9203 Otalgia, bilateral: Secondary | ICD-10-CM

## 2015-06-28 DIAGNOSIS — J029 Acute pharyngitis, unspecified: Secondary | ICD-10-CM

## 2015-06-28 LAB — POCT RAPID STREP A: Streptococcus, Group A Screen (Direct): NEGATIVE

## 2015-06-28 MED ORDER — IBUPROFEN 800 MG PO TABS: 800.0000 mg | ORAL_TABLET | Freq: Three times a day (TID) | ORAL | Status: DC

## 2015-06-28 MED ORDER — MOMETASONE FUROATE 50 MCG/ACT NA SUSP: 2.0000 | Freq: Every day | NASAL | Status: DC

## 2015-06-28 MED ORDER — PSEUDOEPHEDRINE-GUAIFENESIN ER 120-1200 MG PO TB12: 1.0000 | ORAL_TABLET | Freq: Two times a day (BID) | ORAL | Status: DC | PRN

## 2015-06-28 MED ORDER — ALBUTEROL SULFATE HFA 108 (90 BASE) MCG/ACT IN AERS: 1.0000 | INHALATION_SPRAY | Freq: Four times a day (QID) | RESPIRATORY_TRACT | Status: DC | PRN

## 2015-06-28 NOTE — ED Notes (Signed)
Here with sore throat, post nasal drip, pressure behind ears radiating to neck and now cough today Taking otc Lozenges without relief Denies n.v or diarrhea

## 2015-06-28 NOTE — Discharge Instructions (Signed)
Take the medication as written. Take 1 gram of tylenol with the motrin up to 3 times a day as needed for pain and fever. This is an effective combination. Drink extra fluids. Start taking  the mucinex D to keep the mucus secretions thin. Use a neti pot or the NeilMed sinus rinse as often as you want to to reduce nasal congestion.Follow the directions on the box. Restart your albuterol as needed for shortness of breath. Continue the Benadryl/Maalox Magic mouthwash mixture 4 times a day as needed for sore throat.  Follow-up with your doctor. Return if you get worse, have a persistent fever >100.4, or for any concerns.   Go to www.goodrx.com to look up your medications. This will give you a list of where you can find your prescriptions at the most affordable prices.

## 2015-06-28 NOTE — ED Provider Notes (Signed)
HPI  SUBJECTIVE:  Shelby Bennett is a 24 y.o. female who presents with sore throat, postnasal drip, bilateral pain for the past 3 days. States she feels feverish, but hasn't documented fevers. She reports nasal congestion in the morning. Reports pain with swallowing and states that she has to yawn to "pop her ears". Reports body aches, posterior headache, shortness of breath with exertion. No aggravating or alleviating factors she has tried Cipro call, Zyrtec, ibuprofen. No nausea, vomiting. No sinus pain/pressure, drooling, trismus, voice changes, neck stiffness, hearing changes, otorrhea. No allergy symptoms, coughing, wheezing, chest pain. She does have sick contacts with similar symptoms. Past medical history of asthma/reactive airway disease, HSV, negative for seasonal allergies, diabetes, hypertension. LMP now    Past Medical History  Diagnosis Date  . Anxiety   . Celiac disease     ? self diagnosis  . Bipolar disorder (HCC)   . Arthritis   . ADHD (attention deficit hyperactivity disorder)   . Allergy   . 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 Asthma   . Mononucleosis 2012  . Ovarian cyst     left  . Depression   . Herpes     Past Surgical History  Procedure Laterality Date  . Appendectomy      Family History  Problem Relation Age of Onset  . Mental illness Sister     PMDD  . Cervical cancer Sister     s/p ablation  . Mental illness Brother     depression  . Mental illness Brother     depression  . Mental illness Mother     bipolar  . Migraines Father   . Colon cancer Maternal Grandfather   . Esophageal cancer Neg Hx   . Rectal cancer Neg Hx   . Stomach cancer Neg Hx     Social History  Substance Use Topics  . Smoking status: Current Every Day Smoker -- 0.70 packs/day for 7 years    Types: Cigarettes    Last Attempt to Quit: 08/29/2011  . Smokeless tobacco: Never Used  . Alcohol Use: No    No current facility-administered medications for this  encounter.  Current outpatient prescriptions:  .  Calcium Carb-Cholecalciferol (CALCIUM-VITAMIN D) 600-400 MG-UNIT TABS, Take 1 tablet by mouth daily., Disp: , Rfl:  .  ibuprofen (ADVIL,MOTRIN) 800 MG tablet, Take 1 tablet (800 mg total) by mouth 3 (three) times daily., Disp: 30 tablet, Rfl: 0 .  Magnesium 300 MG CAPS, Take 1 capsule by mouth daily., Disp: , Rfl:  .  mometasone (NASONEX) 50 MCG/ACT nasal spray, Place 2 sprays into the nose daily., Disp: 17 g, Rfl: 0 .  Multiple Vitamin (MULTIVITAMIN) capsule, Take 1 capsule by mouth daily., Disp: , Rfl:  .  Nutritional Supplements (WELLNESS ESSENTIALS PO), Take 2-6 capsules by mouth daily., Disp: , Rfl:  .  POTASSIUM CITRATE PO, Take 1 tablet by mouth daily., Disp: , Rfl:  .  Pseudoephedrine-Guaifenesin (MUCINEX D) [phone] MG TB12, Take 1 tablet by mouth 2 (two) times daily as needed (congestion)., Disp: 20 each, Rfl: 0 .  [DISCONTINUED] cetirizine (ZYRTEC) 10 MG tablet, Take 10 mg by mouth daily., Disp: , Rfl:   Allergies  Allergen Reactions  . 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 Zithromax [Azithromycin Dihydrate] Diarrhea and Nausea And Vomiting  . Latex Swelling  . [facility]]     Suicidal rash  . Lavender Oil Hives  ROS  As noted in HPI.   Physical Exam  BP 118/67 mmHg  Pulse 60  Temp(Src) 98.2 F (36.8 C) (Oral)  Resp 18  SpO2 100%  LMP 06/27/2015  Constitutional: Well developed, well nourished, no acute distress Eyes: PERRL, EOMI, conjunctiva normal bilaterally HENT: Normocephalic, atraumatic,mucus membranes moist TMs normal bilaterally. TMJ joints nontender, no crepitus bilaterally. External ear canals normal bilaterally. Positive erythematous turbinates with mild nasal congestion. Positive cobblestoning, erythematous oropharynx. Normal tonsils. Respiratory: Clear to auscultation bilaterally, no rales, no wheezing, no rhonchi Cardiovascular: Normal rate and rhythm, no  murmurs, no gallops, no rubs GI: nondistended, Back: no CVAT skin: No rash, skin intact Musculoskeletal:no deformities Neurologic: Alert & oriented x 3, CN II-XII grossly intact, no motor deficits, sensation grossly intact Psychiatric: Speech and behavior appropriate   ED Course   Medications - No data to display  Orders Placed This Encounter  Procedures  . POCT rapid strep A [facility])    Standing Status: Standing     Number of Occurrences: 1     Standing Expiration Date:    Results for orders placed or performed during the hospital encounter of 06/28/15 (from the past 24 hour(s))  POCT rapid strep A [facility])     Status: None   Collection Time: 06/28/15  1:37 PM  Result Value Ref Range   Streptococcus, Group A Screen (Direct) NEGATIVE NEGATIVE   No results found.  ED Clinical Impression  URI (upper respiratory infection)  Pharyngitis  Otalgia of both ears   ED Assessment/Plan   strep negative. Afebrile no antipyretic in the past 4-6 hours. No evidence meningitis, otitis. Has mild maxillary sinus tenderness suggestive of a rhinosinusitis but feel this is most likely viral. Doubt influenza. Follow-up with PMD as needed return here if gets worse. Discussed labs, MDM, plan and followup with patient . Discussed sn/sx that should prompt return to the UC or ED. Patient agrees with plan.   *This clinic note was created using Dragon dictation software. Therefore, there may be occasional mistakes despite careful proofreading.  ?  Domenick GongAshley Cordero Surette, MD 06/30/15 1537

## 2015-06-30 ENCOUNTER — Emergency Department (INDEPENDENT_AMBULATORY_CARE_PROVIDER_SITE_OTHER)
Admission: EM | Admit: 2015-06-30 | Discharge: 2015-06-30 | Disposition: A | Discharge status: Home / Self Care | Payer: Self-pay | Source: Home / Self Care | Attending: Emergency Medicine | Admitting: Emergency Medicine

## 2015-06-30 ENCOUNTER — Encounter (HOSPITAL_COMMUNITY): Payer: Self-pay | Admitting: Emergency Medicine

## 2015-06-30 DIAGNOSIS — J018 Other acute sinusitis: Secondary | ICD-10-CM

## 2015-06-30 LAB — CULTURE, GROUP A STREP: Strep A Culture: NEGATIVE

## 2015-06-30 MED ORDER — AMOXICILLIN 500 MG PO CAPS: 500.0000 mg | ORAL_CAPSULE | Freq: Two times a day (BID) | ORAL | Status: DC

## 2015-06-30 MED ORDER — PREDNISONE 50 MG PO TABS: ORAL_TABLET | ORAL | Status: DC

## 2015-06-30 NOTE — ED Provider Notes (Signed)
CSN: [id number]     Arrival date & time 06/30/15  1346 History   First MD Initiated Contact with Patient 06/30/15 1530     Chief Complaint  Patient presents with  . Follow-up   (Consider location/radiation/quality/duration/timing/severity/associated sxs/prior Treatment) HPI  She is a 24 year old woman here for follow-up of nasal congestion. She was seen here 2 days ago for headache, nasal congestion, sore throat, and URI symptoms. She was diagnosed with a viral URI and given symptomatic treatment. She is here today because when she went to get the nasal spray, it was $200. She is unable to afford this. She reports continued nasal congestion and drainage.  The drainage is now a greenish color. She states the sore throat has resolved. Her primary concern today is the nasal congestion and sinus drainage. She denies any fevers, but states she's been taking ibuprofen around the clock.  Past Medical History  Diagnosis Date  . Anxiety   . Celiac disease     ? self diagnosis  . Bipolar disorder (HCC)   . Arthritis   . ADHD (attention deficit hyperactivity disorder)   . Allergy   . 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 Asthma   . Mononucleosis 2012  . Ovarian cyst     left  . Depression   . Herpes    Past Surgical History  Procedure Laterality Date  . Appendectomy     Family History  Problem Relation Age of Onset  . Mental illness Sister     PMDD  . Cervical cancer Sister     s/p ablation  . Mental illness Brother     depression  . Mental illness Brother     depression  . Mental illness Mother     bipolar  . Migraines Father   . Colon cancer Maternal Grandfather   . Esophageal cancer Neg Hx   . Rectal cancer Neg Hx   . Stomach cancer Neg Hx    Social History  Substance Use Topics  . Smoking status: Current Every Day Smoker -- 0.70 packs/day for 7 years    Types: Cigarettes    Last Attempt to Quit: 08/29/2011  . Smokeless tobacco: Never Used  . Alcohol Use: No   OB  History    Gravida Para Term Preterm AB TAB SAB Ectopic Multiple Living   0 0 0 0 0 0 0 0 0 0      Review of Systems As in history of present illness Allergies  Diclofenac sodium; Zithromax; Latex; Equetro; and Lavender oil  Home Medications   Prior to Admission medications   Medication Sig Start Date End Date Taking? Authorizing Provider  albuterol (PROVENTIL HFA;VENTOLIN HFA) 108 (90 BASE) MCG/ACT inhaler Inhale 1-2 puffs into the lungs every 6 (six) hours as needed for wheezing or shortness of breath. 06/28/15   Domenick GongAshley Mortenson, MD  amoxicillin (AMOXIL) 500 MG capsule Take 1 capsule (500 mg total) by mouth 2 (two) times daily. 06/30/15   Charm RingsErin J Chevez Sambrano, MD  Calcium Carb-Cholecalciferol (CALCIUM-VITAMIN D) 600-400 MG-UNIT TABS Take 1 tablet by mouth daily.    Historical Provider, MD  ibuprofen (ADVIL,MOTRIN) 800 MG tablet Take 1 tablet (800 mg total) by mouth 3 (three) times daily. 06/28/15   Domenick GongAshley Mortenson, MD  Magnesium 300 MG CAPS Take 1 capsule by mouth daily.    Historical Provider, MD  mometasone (NASONEX) 50 MCG/ACT nasal spray Place 2 sprays into the nose daily. 06/28/15   Domenick GongAshley Mortenson,  MD  Multiple Vitamin (MULTIVITAMIN) capsule Take 1 capsule by mouth daily.    Historical Provider, MD  Nutritional Supplements (WELLNESS ESSENTIALS PO) Take 2-6 capsules by mouth daily.    Historical Provider, MD  POTASSIUM CITRATE PO Take 1 tablet by mouth daily.    Historical Provider, MD  predniSONE (DELTASONE) 50 MG tablet Take 1 pill daily for 5 days. 06/30/15   Charm Rings, MD  Pseudoephedrine-Guaifenesin (MUCINEX D) [phone] MG TB12 Take 1 tablet by mouth 2 (two) times daily as needed (congestion). 06/28/15   Domenick Gong, MD   Meds Ordered and Administered this Visit  Medications - No data to display  BP 114/67 mmHg  Pulse 82  Temp(Src) 98.4 F (36.9 C) (Oral)  SpO2 100%  LMP 06/27/2015 No data found.   Physical Exam  Constitutional: She is oriented to person, place,  and time. She appears well-developed and well-nourished. No distress.  HENT:  Mouth/Throat: No oropharyngeal exudate.  Nasal mucosa is erythematous and boggy. Mild postnasal drainage seen.  Neck: Neck supple.  Cardiovascular: Normal rate, regular rhythm and normal heart sounds.   No murmur heard. Pulmonary/Chest: Effort normal and breath sounds normal. No respiratory distress. She has no wheezes. She has no rales.  Lymphadenopathy:    She has no cervical adenopathy.  Neurological: She is alert and oriented to person, place, and time.    ED Course  Procedures (including critical care time)  Labs Review Labs Reviewed - No data to display  Imaging Review No results found.   MDM   1. Other acute sinusitis    Likely viral process. I did provide a free sample of Rhinocort. Recommended continued symptomatic treatment as previously prescribed. I did provide prescriptions for prednisone and amoxicillin to be filled on Monday if she is not improving.    Charm Rings, MD 06/30/15 [id number]

## 2015-06-30 NOTE — ED Notes (Signed)
Follow up.

## 2015-06-30 NOTE — Discharge Instructions (Signed)
You likely have a virus causing your symptoms. I have given you a sample of Rhinocort nasal spray. This will help with the congestion and swelling in your nose. Get some nasal saline spray at the drug store and use this at least 3 or 4 times a day. If you are not starting to feel better by Monday, please fill the prescriptions for prednisone and amoxicillin. Follow-up as needed.

## 2015-07-07 NOTE — ED Notes (Signed)
patient called, c/o she is developing yeast infection, and is requesting Diflucan rx. Discussed w Dr Griffin BasilJD Kindl, who authorized diflucan 150 now, and one in 1 week if still having symptoms. Called and left detailed message on store answering machine , [facility] [address] at patient request

## 2015-09-03 IMAGING — CT CT ABD-PELV W/ CM
1 of 2 series · 15 of 32 positions shown, 19 images · IV contrast (OMNIPAQUE 300)
Comparison: CT scan of June 30, 2005.

CLINICAL DATA: Abdominal pain, diarrhea.

EXAM:
CT ABDOMEN AND PELVIS WITH CONTRAST
TECHNIQUE: Multidetector CT imaging of the abdomen and pelvis was performed
using the standard protocol following bolus administration of
intravenous contrast.
CONTRAST:  50mL OMNIPAQUE IOHEXOL 300 MG/ML SOLN, 100mL OMNIPAQUE
IOHEXOL 300 MG/ML SOLN

[Series 2: abd/pel with · axial · 0.69mm/px · z∈[-514,-90]mm · 15 of 93 slices shown, 19 images]
[im 4/93  soft-tissue]
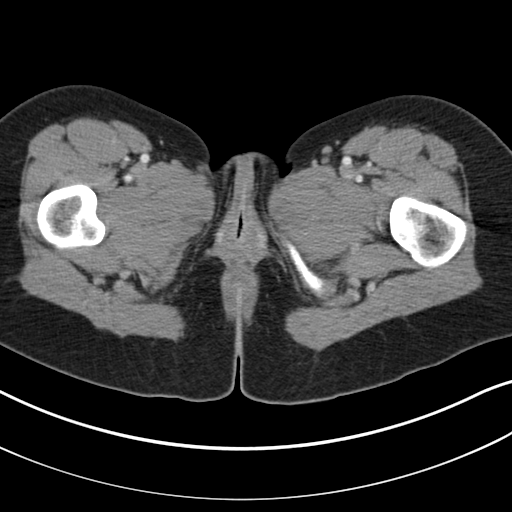
[im 4/93  bone]
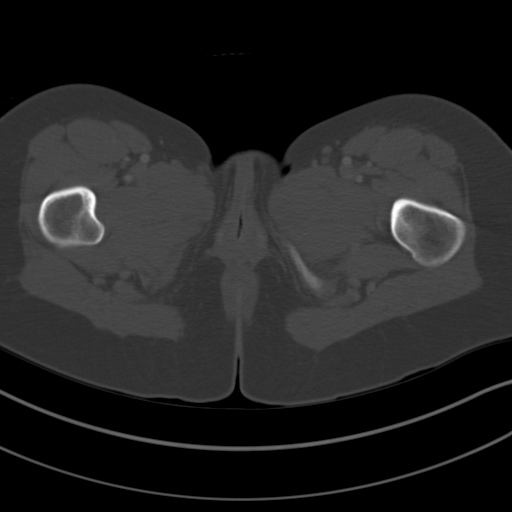
[im 12/93  soft-tissue]
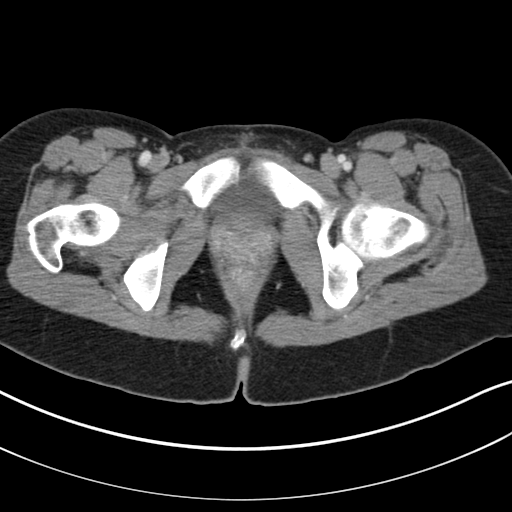
[im 20/93  soft-tissue]
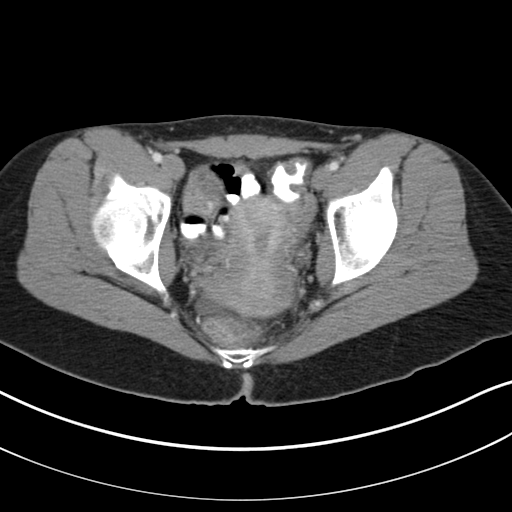
[im 27/93  soft-tissue]
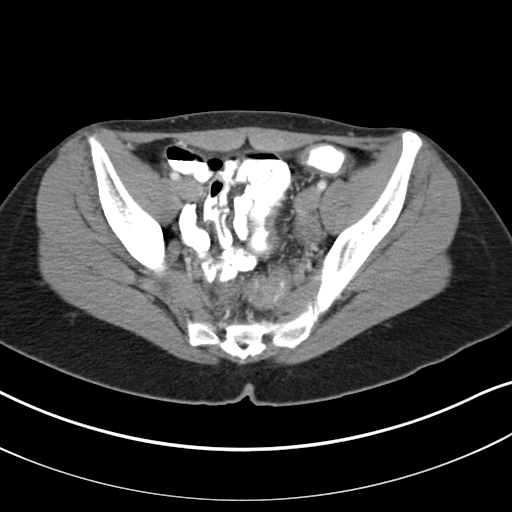
[im 31/93  soft-tissue]
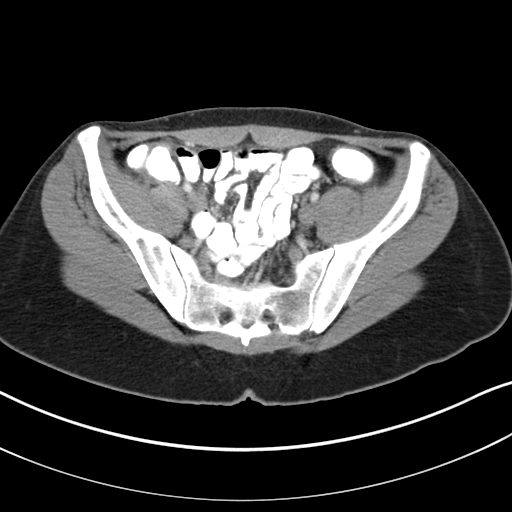
[im 39/93  soft-tissue]
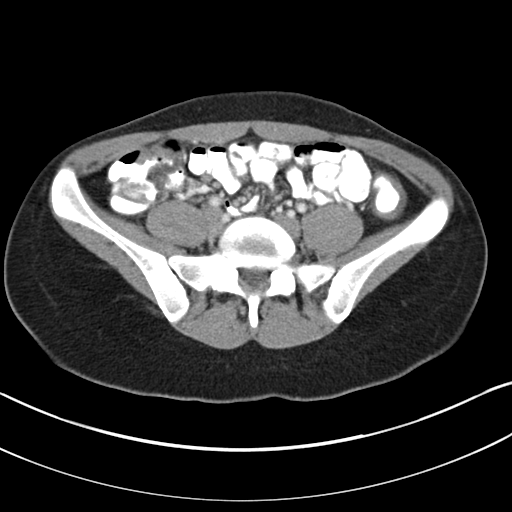
[im 47/93  soft-tissue]
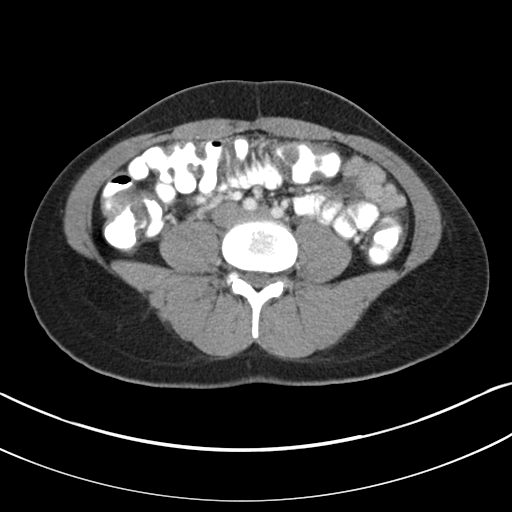
[im 54/93  soft-tissue]
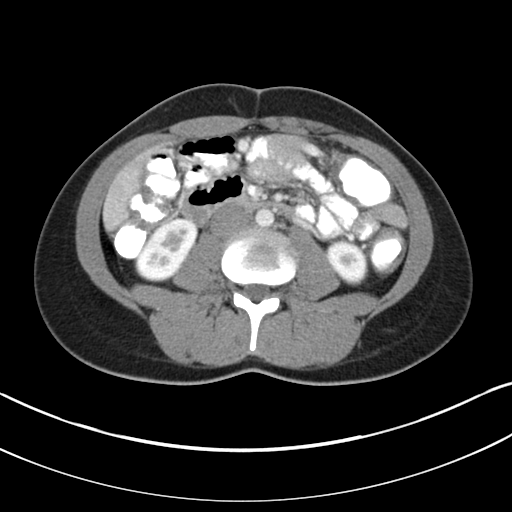
[im 62/93  soft-tissue]
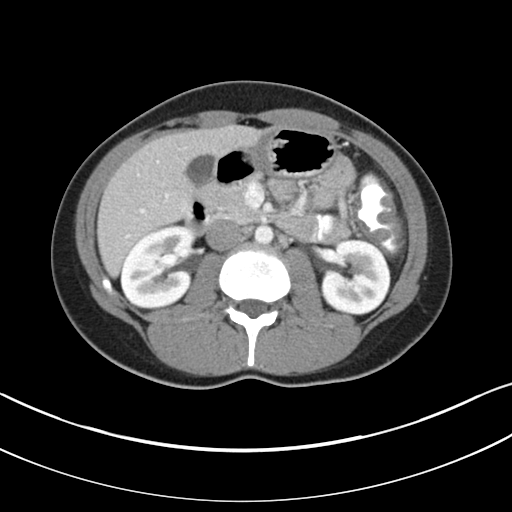
[im 62/93  bone]
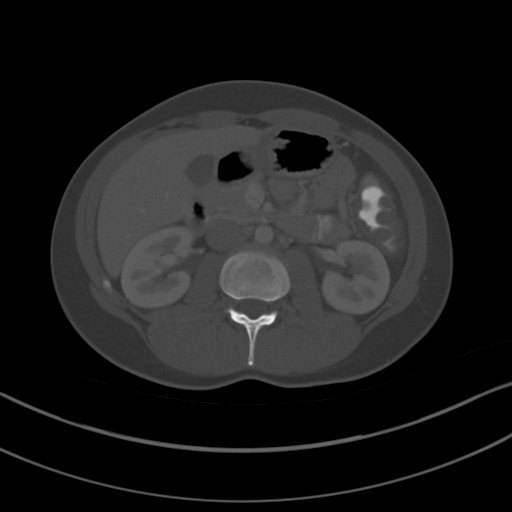
[im 66/93  soft-tissue]
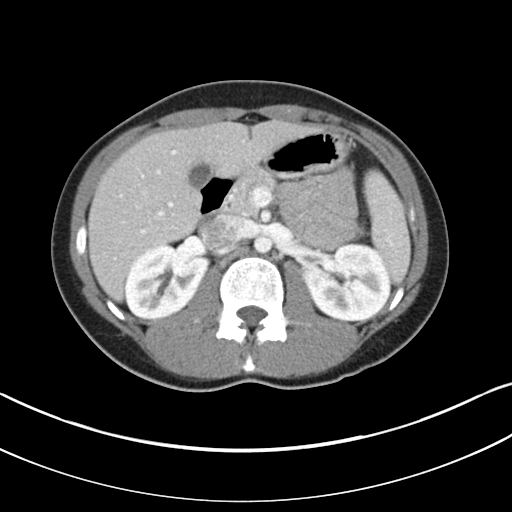
[im 73/93  soft-tissue]
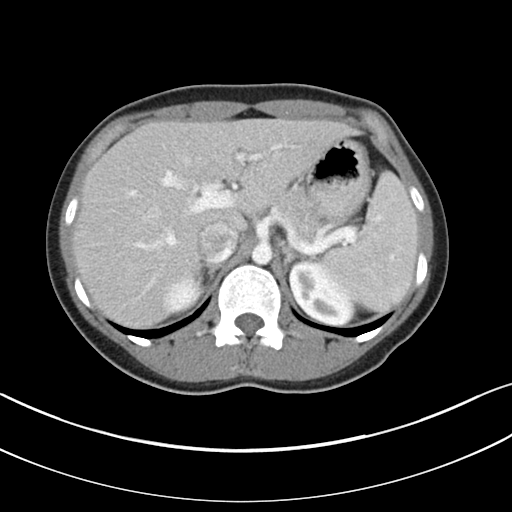
[im 77/93  lung]
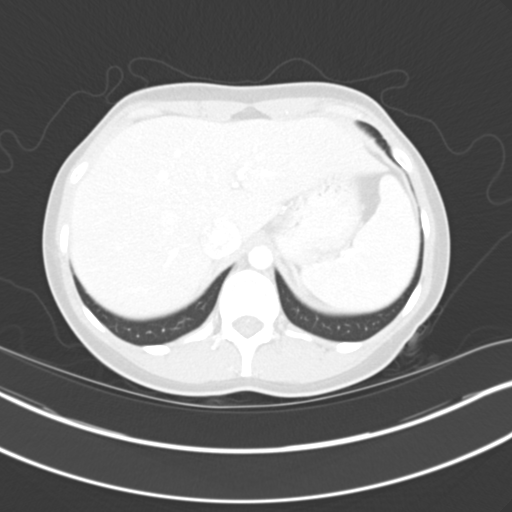
[im 81/93  soft-tissue]
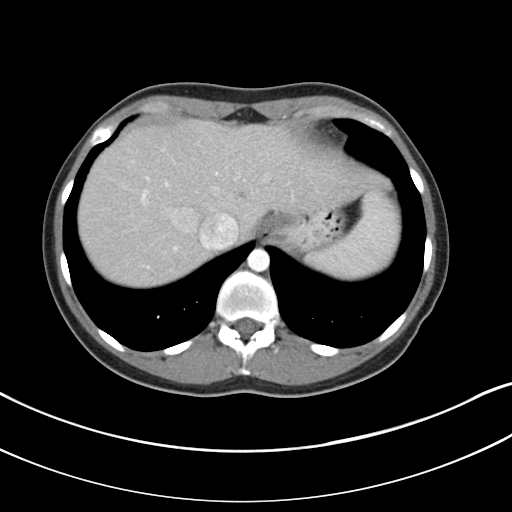
[im 81/93  lung]
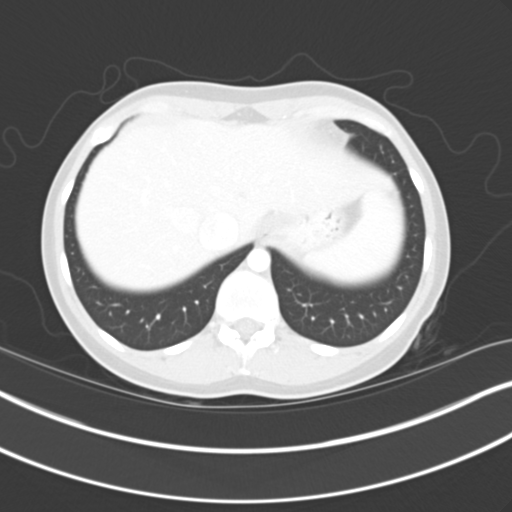
[im 85/93  lung]
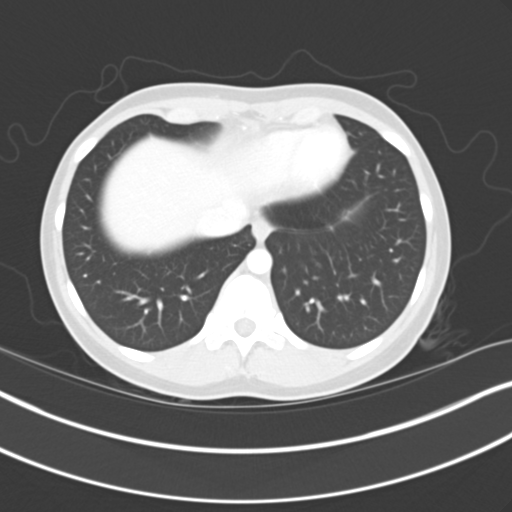
[im 89/93  soft-tissue]
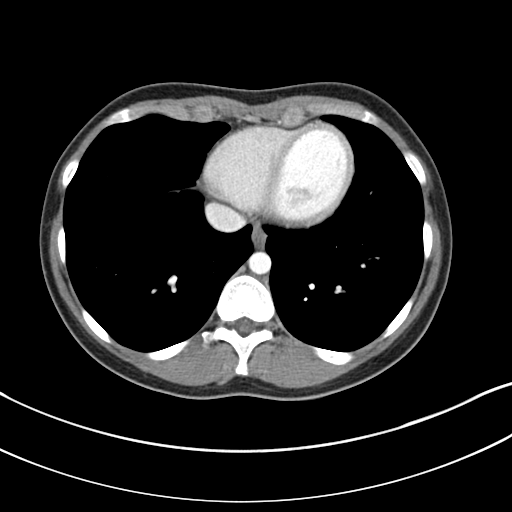
[im 89/93  lung]
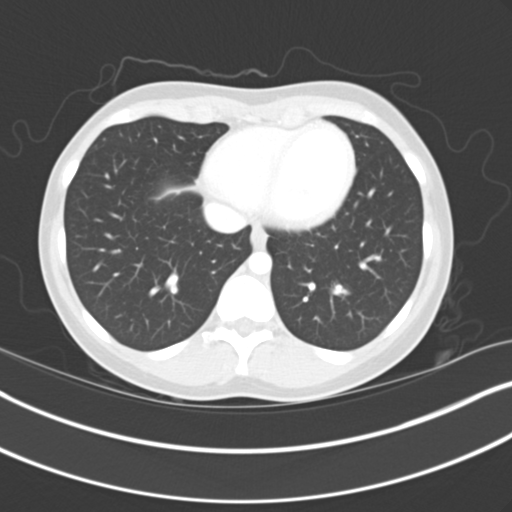

[15 of 32 positions shown; findings below may reference images not displayed]

FINDINGS: Visualized lung bases appear normal. No osseous abnormality is
noted.

No gallstones are noted. The liver, spleen and pancreas appear
normal. Adrenal glands and kidneys appear normal. No hydronephrosis
or renal obstruction is noted. Patient is status post appendectomy.
Mild wall and fold thickening is seen throughout the colon
suggesting mild colitis. No abnormal fluid collection is noted
urinary bladder appears normal. 1.9 cm left ovarian cyst is noted.
No significant adenopathy is noted.
IMPRESSION: Mild wall and fold thickening is seen throughout the colon,
particularly in the transverse and descending portions. This is
concerning for possible inflammatory or infectious colitis.

## 2016-01-15 ENCOUNTER — Ambulatory Visit: Payer: Self-pay

## 2016-01-16 ENCOUNTER — Encounter (HOSPITAL_COMMUNITY): Payer: Self-pay | Admitting: Emergency Medicine

## 2016-01-16 ENCOUNTER — Ambulatory Visit (HOSPITAL_COMMUNITY)
Admission: RE | Admit: 2016-01-16 | Discharge: 2016-01-16 | Disposition: A | Discharge status: Home / Self Care | Payer: Self-pay | Attending: Psychiatry | Admitting: Psychiatry

## 2016-01-16 ENCOUNTER — Ambulatory Visit (HOSPITAL_COMMUNITY)
Admission: EM | Admit: 2016-01-16 | Discharge: 2016-01-16 | Disposition: A | Discharge status: Home / Self Care | Payer: Self-pay | Attending: Family Medicine | Admitting: Family Medicine

## 2016-01-16 DIAGNOSIS — J45909 Unspecified asthma, uncomplicated: Secondary | ICD-10-CM | POA: Insufficient documentation

## 2016-01-16 DIAGNOSIS — F909 Attention-deficit hyperactivity disorder, unspecified type: Secondary | ICD-10-CM | POA: Insufficient documentation

## 2016-01-16 DIAGNOSIS — Z8673 Personal history of transient ischemic attack (TIA), and cerebral infarction without residual deficits: Secondary | ICD-10-CM | POA: Insufficient documentation

## 2016-01-16 DIAGNOSIS — B2790 Infectious mononucleosis, unspecified without complication: Secondary | ICD-10-CM | POA: Insufficient documentation

## 2016-01-16 DIAGNOSIS — F310 Bipolar disorder, current episode hypomanic: Secondary | ICD-10-CM | POA: Insufficient documentation

## 2016-01-16 DIAGNOSIS — F419 Anxiety disorder, unspecified: Secondary | ICD-10-CM

## 2016-01-16 DIAGNOSIS — B009 Herpesviral infection, unspecified: Secondary | ICD-10-CM | POA: Insufficient documentation

## 2016-01-16 DIAGNOSIS — F3162 Bipolar disorder, current episode mixed, moderate: Secondary | ICD-10-CM

## 2016-01-16 DIAGNOSIS — F17210 Nicotine dependence, cigarettes, uncomplicated: Secondary | ICD-10-CM | POA: Insufficient documentation

## 2016-01-16 DIAGNOSIS — N83202 Unspecified ovarian cyst, left side: Secondary | ICD-10-CM | POA: Insufficient documentation

## 2016-01-16 NOTE — ED Notes (Signed)
[facility]. The patient stated that she has a hx of hypomania and has not been treated at behavioral health nor had any meds for 3 years. The patient stated that she has been having a crisis for 4 days.

## 2016-01-16 NOTE — Discharge Instructions (Signed)
Panic Attacks Panic attacks are sudden, short feelings of great fear or discomfort. You may have them for no reason when you are relaxed, when you are uneasy (anxious), or when you are sleeping.  HOME CARE  Take all your medicines as told.  Check with your doctor before starting new medicines.  Keep all doctor visits. GET HELP IF:  You are not able to take your medicines as told.  Your symptoms do not get better.  Your symptoms get worse. GET HELP RIGHT AWAY IF:  Your attacks seem different than your normal attacks.  You have thoughts about hurting yourself or others.  You take panic attack medicine and you have a side effect. MAKE SURE YOU:  Understand these instructions.  Will watch your condition.  Will get help right away if you are not doing well or get worse.   This information is not intended to replace advice given to you by your health care provider. Make sure you discuss any questions you have with your health care provider.   Document Released: 07/27/2010 Document Revised: 04/14/2013 Document Reviewed: 02/05/2013 Elsevier Interactive Patient Education 2016 Else[employer] Inc[employer]  - Generalized Anxiety Disorder Generalized anxiety disorder (GAD) is a mental disorder. It interferes with life functions, including relationships, work, and school. GAD is different from normal anxiety, which everyone experiences at some point in their lives in response to specific life events and activities. Normal anxiety actually helps us prepare for and get through these life events and activities. Normal anxiety goes away after the event or activity is over.  GAD causes anxiety that is not necessarily related to specific events or activities. It also causes excess anxiety in proportion to specific events or activities. The anxiety associated with GAD is also difficult to control. GAD can vary from mild to severe. People with severe GAD can have intense waves of anxiety with physical symptoms  (panic attacks).  SYMPTOMS The anxiety and worry associated with GAD are difficult to control. This anxiety and worry are related to many life events and activities and also occur more days than not for 6 months or longer. People with GAD also have three or more of the following symptoms (one or more in children):  Restlessness.   Fatigue.  Difficulty concentrating.   Irritability.  Muscle tension.  Difficulty sleeping or unsatisfying sleep. DIAGNOSIS GAD is diagnosed through an assessment by your health care provider. Your health care provider will ask you questions aboutyour mood,physical symptoms, and events in your life. Your health care provider may ask you about your medical history and use of alcohol or drugs, including prescription medicines. Your health care provider may also do a physical exam and blood tests. Certain medical conditions and the use of certain substances can cause symptoms similar to those associated with GAD. Your health care provider may refer you to a mental health specialist for further evaluation. TREATMENT The following therapies are usually used to treat GAD:   Medication. Antidepressant medication usually is prescribed for long-term daily control. Antianxiety medicines may be added in severe cases, especially when panic attacks occur.   Talk therapy (psychotherapy). Certain types of talk therapy can be helpful in treating GAD by providing support, education, and guidance. A form of talk therapy called cognitive behavioral therapy can teach you healthy ways to think about and react to daily life events and activities.  Stress managementtechniques. These include yoga, meditation, and exercise and can be very helpful when they are practiced regularly. A mental health specialist can   help determine which treatment is best for you. Some people see improvement with one therapy. However, other people require a combination of therapies.   This information is  not intended to replace advice given to you by your health care provider. Make sure you discuss any questions you have with your health care provider.   Document Released: 10/19/2012 Document Revised: 07/15/2014 Document Reviewed: 10/19/2012 Elsevier Interactive Patient Education 2016 Elsevier Inc.   

## 2016-01-16 NOTE — ED Provider Notes (Signed)
CSN: [id number]     Arrival date & time 01/16/16  1116 History   First MD Initiated Contact with Patient 01/16/16 1152     Chief Complaint  Patient presents with  . Anxiety   (Consider location/radiation/quality/duration/timing/severity/associated sxs/prior Treatment) HPI Comments: 25 year old female with a history of anxiety, bipolar disorder, depression and ADHD presents to the urgent care for increase in anxiety over the past 4 days. She originally stated she was having some hypomania with excessive crying, rational anger problems with sleeping and panic attacks. She was originally seen at behavioral health in 2014 and treated with psychotropics. She stopped taking them after a period of time and has been feeling well up until recently. Denies SI or HI. She is alert, oriented and does not express any signs of psychoses.   Past Medical History  Diagnosis Date  . Anxiety   . Celiac disease     ? self diagnosis  . Bipolar disorder (HCC)   . Arthritis   . ADHD (attention deficit hyperactivity disorder)   . Allergy   . 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 Asthma   . Mononucleosis 2012  . Ovarian cyst     left  . Depression   . Herpes    Past Surgical History  Procedure Laterality Date  . Appendectomy     Family History  Problem Relation Age of Onset  . Mental illness Sister     PMDD  . Cervical cancer Sister     s/p ablation  . Mental illness Brother     depression  . Mental illness Brother     depression  . Mental illness Mother     bipolar  . Migraines Father   . Colon cancer Maternal Grandfather   . Esophageal cancer Neg Hx   . Rectal cancer Neg Hx   . Stomach cancer Neg Hx    Social History  Substance Use Topics  . Smoking status: Current Every Day Smoker -- 0.70 packs/day for 7 years    Types: Cigarettes    Last Attempt to Quit: 08/29/2011  . Smokeless tobacco: Never Used  . Alcohol Use: No   OB History    Gravida Para Term Preterm AB TAB SAB Ectopic  Multiple Living   0 0 0 0 0 0 0 0 0 0      Review of Systems  Constitutional: Negative.   HENT: Negative.   Respiratory: Negative.   Skin: Negative.   Neurological: Negative.   Psychiatric/Behavioral: Positive for sleep disturbance and dysphoric mood. Negative for suicidal ideas and confusion. The patient is nervous/anxious.   All other systems reviewed and are negative.   Allergies  Diclofenac sodium; Zithromax; Latex; Equetro; and Lavender oil  Home Medications   Prior to Admission medications   Medication Sig Start Date End Date Taking? Authorizing Provider  ALPRAZolam Prudy Feeler(XANAX) 0.5 MG tablet Take 0.5 mg by mouth at bedtime as needed for anxiety.   Yes Historical Provider, MD  ibuprofen (ADVIL,MOTRIN) 800 MG tablet Take 1 tablet (800 mg total) by mouth 3 (three) times daily. 06/28/15  Yes Domenick GongAshley Mortenson, MD  albuterol (PROVENTIL HFA;VENTOLIN HFA) 108 (90 BASE) MCG/ACT inhaler Inhale 1-2 puffs into the lungs every 6 (six) hours as needed for wheezing or shortness of breath. 06/28/15   Domenick GongAshley Mortenson, MD  amoxicillin (AMOXIL) 500 MG capsule Take 1 capsule (500 mg total) by mouth 2 (two) times daily. 06/30/15   Charm RingsErin J Honig, MD  Calcium Carb-Cholecalciferol (  CALCIUM-VITAMIN D) 600-400 MG-UNIT TABS Take 1 tablet by mouth daily.    Historical Provider, MD  Magnesium 300 MG CAPS Take 1 capsule by mouth daily.    Historical Provider, MD  mometasone (NASONEX) 50 MCG/ACT nasal spray Place 2 sprays into the nose daily. 06/28/15   Domenick Gong, MD  Multiple Vitamin (MULTIVITAMIN) capsule Take 1 capsule by mouth daily.    Historical Provider, MD  Nutritional Supplements (WELLNESS ESSENTIALS PO) Take 2-6 capsules by mouth daily.    Historical Provider, MD  POTASSIUM CITRATE PO Take 1 tablet by mouth daily.    Historical Provider, MD  predniSONE (DELTASONE) 50 MG tablet Take 1 pill daily for 5 days. 06/30/15   Charm Rings, MD  Pseudoephedrine-Guaifenesin (MUCINEX D) [phone] MG TB12 Take 1  tablet by mouth 2 (two) times daily as needed (congestion). 06/28/15   Domenick Gong, MD   Meds Ordered and Administered this Visit  Medications - No data to display  BP 110/68 mmHg  Pulse 62  Temp(Src) 98 F (36.7 C) (Oral)  Resp 20  SpO2 99%  LMP 01/16/2016 (Exact Date) No data found.   Physical Exam  Constitutional: She is oriented to person, place, and time. She appears well-developed and well-nourished. No distress.  Eyes: EOM are normal.  Neck: Normal range of motion. Neck supple.  Cardiovascular: Normal rate.   Pulmonary/Chest: Effort normal. No respiratory distress.  Musculoskeletal: She exhibits no edema.  Neurological: She is alert and oriented to person, place, and time. She exhibits normal muscle tone.  Skin: Skin is warm and dry.  Psychiatric: Her speech is normal. Her mood appears anxious. She is not slowed and not combative. Thought content is not delusional. Cognition and memory are normal. She exhibits a depressed mood. She expresses no homicidal and no suicidal ideation. She is attentive.  Nursing note and vitals reviewed.   ED Course  Procedures (including critical care time)  Labs Review Labs Reviewed - No data to display  Imaging Review No results found.   Visual Acuity Review  Right Eye Distance:   Left Eye Distance:   Bilateral Distance:    Right Eye Near:   Left Eye Near:    Bilateral Near:         MDM   1. Anxiety   2. Bipolar disorder, current episode mixed, moderate (HCC)    Explained to patient that she would need treatment other than what can be provided at the urgent care. She states that she just did not know where to go. She tried to call PCPs but no one was excepting new patient's. [facility] in [place] was called. They are open until 3 PM today. They states that they could see her today and should be coming now. The patient was giving instructions she was glad to have this news and very willing and excited to be able to  go. She received information on address and telephone number and states she is going there now. Patient is discharged by the provider.    Hayden Rasmussen, NP 01/16/16 1239
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
## 2016-01-26 ENCOUNTER — Emergency Department (HOSPITAL_COMMUNITY): Payer: Self-pay

## 2016-01-26 ENCOUNTER — Emergency Department (HOSPITAL_COMMUNITY)
Admission: EM | Admit: 2016-01-26 | Discharge: 2016-01-27 | Disposition: A | Discharge status: Other Acute Inpatient Hospital | Payer: Self-pay | Attending: Emergency Medicine | Admitting: Emergency Medicine

## 2016-01-26 ENCOUNTER — Encounter (HOSPITAL_COMMUNITY): Payer: Self-pay | Admitting: *Deleted

## 2016-01-26 DIAGNOSIS — Z79899 Other long term (current) drug therapy: Secondary | ICD-10-CM | POA: Insufficient documentation

## 2016-01-26 DIAGNOSIS — R45851 Suicidal ideations: Secondary | ICD-10-CM | POA: Insufficient documentation

## 2016-01-26 DIAGNOSIS — I493 Ventricular premature depolarization: Secondary | ICD-10-CM | POA: Insufficient documentation

## 2016-01-26 DIAGNOSIS — F17210 Nicotine dependence, cigarettes, uncomplicated: Secondary | ICD-10-CM | POA: Insufficient documentation

## 2016-01-26 DIAGNOSIS — F419 Anxiety disorder, unspecified: Secondary | ICD-10-CM

## 2016-01-26 DIAGNOSIS — M1990 Unspecified osteoarthritis, unspecified site: Secondary | ICD-10-CM | POA: Insufficient documentation

## 2016-01-26 DIAGNOSIS — F3130 Bipolar disorder, current episode depressed, mild or moderate severity, unspecified: Secondary | ICD-10-CM | POA: Diagnosis present

## 2016-01-26 DIAGNOSIS — F329 Major depressive disorder, single episode, unspecified: Secondary | ICD-10-CM | POA: Insufficient documentation

## 2016-01-26 DIAGNOSIS — J45909 Unspecified asthma, uncomplicated: Secondary | ICD-10-CM | POA: Insufficient documentation

## 2016-01-26 DIAGNOSIS — R0602 Shortness of breath: Secondary | ICD-10-CM | POA: Insufficient documentation

## 2016-01-26 DIAGNOSIS — F410 Panic disorder [episodic paroxysmal anxiety] without agoraphobia: Secondary | ICD-10-CM | POA: Insufficient documentation

## 2016-01-26 DIAGNOSIS — Z8673 Personal history of transient ischemic attack (TIA), and cerebral infarction without residual deficits: Secondary | ICD-10-CM | POA: Insufficient documentation

## 2016-01-26 HISTORY — DX: Post-traumatic stress disorder, unspecified: F43.10

## 2016-01-26 LAB — COMPREHENSIVE METABOLIC PANEL
ALT: 12 U/L — AB (ref 14–54)
AST: 16 U/L (ref 15–41)
Albumin: 4.9 g/dL (ref 3.5–5.0)
Alkaline Phosphatase: 35 U/L — ABNORMAL LOW (ref 38–126)
Anion gap: 8 (ref 5–15)
BUN: 11 mg/dL (ref 6–20)
CHLORIDE: 107 mmol/L (ref 101–111)
CO2: 23 mmol/L (ref 22–32)
CREATININE: 0.71 mg/dL (ref 0.44–1.00)
Calcium: 9.4 mg/dL (ref 8.9–10.3)
GFR calc non Af Amer: >60 mL/min (ref >60)
Glucose, Bld: 97 mg/dL (ref 65–99)
POTASSIUM: 4.2 mmol/L (ref 3.5–5.1)
SODIUM: 138 mmol/L (ref 135–145)
Total Bilirubin: 0.8 mg/dL (ref 0.3–1.2)
Total Protein: 7.2 g/dL (ref 6.5–8.1)

## 2016-01-26 LAB — I-STAT BETA HCG BLOOD, ED (MC, WL, AP ONLY): I-stat hCG, quantitative: <5 m[IU]/mL (ref <5)

## 2016-01-26 LAB — CBC WITH DIFFERENTIAL/PLATELET
BASOS ABS: 0.1 10*3/uL (ref 0.0–0.1)
Basophils Relative: 1 %
EOS ABS: 0.1 10*3/uL (ref 0.0–0.7)
Eosinophils Relative: 1 %
HCT: 42.8 % (ref 36.0–46.0)
Hemoglobin: 14.7 g/dL (ref 12.0–15.0)
LYMPHS ABS: 2.2 10*3/uL (ref 0.7–4.0)
LYMPHS PCT: 30 %
MCH: 31.8 pg (ref 26.0–34.0)
MCHC: 34.3 g/dL (ref 30.0–36.0)
MCV: 92.6 fL (ref 78.0–100.0)
Monocytes Absolute: 0.5 10*3/uL (ref 0.1–1.0)
Monocytes Relative: 6 %
NEUTROS PCT: 62 %
Neutro Abs: 4.5 10*3/uL (ref 1.7–7.7)
PLATELETS: 195 10*3/uL (ref 150–400)
RBC: 4.62 MIL/uL (ref 3.87–5.11)
RDW: 12.2 % (ref 11.5–15.5)
WBC: 7.3 10*3/uL (ref 4.0–10.5)

## 2016-01-26 LAB — ETHANOL

## 2016-01-26 LAB — ACETAMINOPHEN LEVEL

## 2016-01-26 LAB — RAPID URINE DRUG SCREEN, HOSP PERFORMED
Amphetamines: NOT DETECTED
Barbiturates: NOT DETECTED
Benzodiazepines: NOT DETECTED
COCAINE: NOT DETECTED
OPIATES: NOT DETECTED
TETRAHYDROCANNABINOL: NOT DETECTED

## 2016-01-26 LAB — SALICYLATE LEVEL: Salicylate Lvl: <4 mg/dL (ref 2.8–30.0)

## 2016-01-26 LAB — I-STAT TROPONIN, ED: TROPONIN I, POC: 0 ng/mL (ref 0.00–0.08)

## 2016-01-26 MED ORDER — QUETIAPINE FUMARATE ER 300 MG PO TB24: 300.0000 mg | ORAL_TABLET | Freq: Every day | ORAL | Status: DC

## 2016-01-26 MED ORDER — LORAZEPAM 1 MG PO TABS
1.0000 mg | ORAL_TABLET | Freq: Three times a day (TID) | ORAL | Status: DC | PRN
  Administered 2016-01-27: 1 mg via ORAL
  Filled 2016-01-26: qty 1

## 2016-01-26 MED ORDER — QUETIAPINE FUMARATE ER 50 MG PO TB24: 50.0000 mg | ORAL_TABLET | ORAL | Status: DC

## 2016-01-26 MED ORDER — CALCIUM-VITAMIN D 600-400 MG-UNIT PO TABS: 1.0000 | ORAL_TABLET | Freq: Every day | ORAL | Status: DC

## 2016-01-26 MED ORDER — ACETAMINOPHEN 325 MG PO TABS: 650.0000 mg | ORAL_TABLET | ORAL | Status: DC | PRN

## 2016-01-26 MED ORDER — ZOLPIDEM TARTRATE 5 MG PO TABS: 5.0000 mg | ORAL_TABLET | Freq: Every evening | ORAL | Status: DC | PRN

## 2016-01-26 MED ORDER — CALCIUM CARBONATE-VITAMIN D 500-200 MG-UNIT PO TABS
1.0000 | ORAL_TABLET | Freq: Every day | ORAL | Status: DC
  Administered 2016-01-27: 1 via ORAL
  Filled 2016-01-26: qty 1

## 2016-01-26 MED ORDER — QUETIAPINE FUMARATE ER 200 MG PO TB24
200.0000 mg | ORAL_TABLET | Freq: Every day | ORAL | Status: AC
  Administered 2016-01-26: 200 mg via ORAL
  Filled 2016-01-26 (×2): qty 1

## 2016-01-26 MED ORDER — IBUPROFEN 200 MG PO TABS: 600.0000 mg | ORAL_TABLET | Freq: Three times a day (TID) | ORAL | Status: DC | PRN

## 2016-01-26 MED ORDER — MAGNESIUM 300 MG PO CAPS: 1.0000 | ORAL_CAPSULE | Freq: Every day | ORAL | Status: DC

## 2016-01-26 MED ORDER — ALUM & MAG HYDROXIDE-SIMETH 200-200-20 MG/5ML PO SUSP: 30.0000 mL | ORAL | Status: DC | PRN

## 2016-01-26 MED ORDER — ALPRAZOLAM 0.5 MG PO TABS
0.5000 mg | ORAL_TABLET | Freq: Every evening | ORAL | Status: DC | PRN
  Administered 2016-01-26: 0.5 mg via ORAL
  Filled 2016-01-26: qty 1

## 2016-01-26 MED ORDER — LORAZEPAM 1 MG PO TABS
1.0000 mg | ORAL_TABLET | Freq: Once | ORAL | Status: AC
  Administered 2016-01-26: 1 mg via ORAL
  Filled 2016-01-26: qty 1

## 2016-01-26 MED ORDER — ONDANSETRON HCL 4 MG PO TABS: 4.0000 mg | ORAL_TABLET | Freq: Three times a day (TID) | ORAL | Status: DC | PRN

## 2016-01-26 MED ORDER — MAGNESIUM OXIDE 400 (241.3 MG) MG PO TABS
400.0000 mg | ORAL_TABLET | Freq: Every day | ORAL | Status: DC
  Administered 2016-01-27: 400 mg via ORAL
  Filled 2016-01-26: qty 1

## 2016-01-26 NOTE — BH Assessment (Signed)
Assessment completed.  Consulted with Maryjean Mornharles Kober, PA-C who recommends inpatient treatment at this time.   Davina PokeJoVea Nyree Yonker, LCSW Therapeutic Triage Specialist [facility] 01/26/2016 9:49 PM
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
## 2016-01-26 NOTE — ED Notes (Signed)
TTS at bedside. 

## 2016-01-26 NOTE — ED Notes (Signed)
Pt wanded by security. 
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
## 2016-01-26 NOTE — ED Notes (Signed)
Pt. Transferred to [facility] from ED to room 34. Report from Ameren CorporationKarhryn RN. Pt. Oriented to unit including Q15 minute rounds as well as the security cameras for their protection. Patient is alert and oriented, warm and dry in no acute distress. Patient denies SI, HI, and AVH. Pt. Encouraged to let me know if needs arise.

## 2016-01-26 NOTE — ED Notes (Signed)
Patient noted sleeping in room. No complaints, stable, in no acute distress. Q15 minute rounds and monitoring via Security Cameras to continue.  

## 2016-01-26 NOTE — ED Notes (Signed)
[id number] Expected date:  Expected time:  Means of arrival:  Comments: Room13
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
## 2016-01-26 NOTE — ED Notes (Signed)
Patient changing into scrubs. Instructed to take jewelry off (including bobby pins) and place in cup. Patient's husband taking clothes and jewelry home with him.

## 2016-01-27 ENCOUNTER — Inpatient Hospital Stay (HOSPITAL_COMMUNITY)
Admission: AD | Admit: 2016-01-27 | Discharge: 2016-01-30 | DRG: 885 | Disposition: A | Discharge status: Home / Self Care | Payer: Federal, State, Local not specified - Other | Source: Intra-hospital | Attending: Psychiatry | Admitting: Psychiatry

## 2016-01-27 ENCOUNTER — Encounter (HOSPITAL_COMMUNITY): Payer: Self-pay | Admitting: General Practice

## 2016-01-27 DIAGNOSIS — F17210 Nicotine dependence, cigarettes, uncomplicated: Secondary | ICD-10-CM | POA: Diagnosis present

## 2016-01-27 DIAGNOSIS — F909 Attention-deficit hyperactivity disorder, unspecified type: Secondary | ICD-10-CM | POA: Diagnosis present

## 2016-01-27 DIAGNOSIS — F4310 Post-traumatic stress disorder, unspecified: Secondary | ICD-10-CM | POA: Diagnosis present

## 2016-01-27 DIAGNOSIS — G4700 Insomnia, unspecified: Secondary | ICD-10-CM | POA: Diagnosis present

## 2016-01-27 DIAGNOSIS — R45851 Suicidal ideations: Secondary | ICD-10-CM | POA: Diagnosis present

## 2016-01-27 DIAGNOSIS — Z818 Family history of other mental and behavioral disorders: Secondary | ICD-10-CM

## 2016-01-27 DIAGNOSIS — F410 Panic disorder [episodic paroxysmal anxiety] without agoraphobia: Secondary | ICD-10-CM | POA: Diagnosis present

## 2016-01-27 DIAGNOSIS — F314 Bipolar disorder, current episode depressed, severe, without psychotic features: Secondary | ICD-10-CM | POA: Diagnosis present

## 2016-01-27 DIAGNOSIS — F3130 Bipolar disorder, current episode depressed, mild or moderate severity, unspecified: Secondary | ICD-10-CM | POA: Diagnosis present

## 2016-01-27 MED ORDER — MAGNESIUM OXIDE 400 (241.3 MG) MG PO TABS
400.0000 mg | ORAL_TABLET | Freq: Every day | ORAL | Status: DC
  Administered 2016-01-28 – 2016-01-30 (×3): 400 mg via ORAL
  Filled 2016-01-27 (×5): qty 1

## 2016-01-27 MED ORDER — QUETIAPINE FUMARATE ER 300 MG PO TB24
300.0000 mg | ORAL_TABLET | Freq: Every day | ORAL | Status: DC
  Administered 2016-01-27: 300 mg via ORAL
  Filled 2016-01-27 (×3): qty 1

## 2016-01-27 MED ORDER — LORAZEPAM 1 MG PO TABS
1.0000 mg | ORAL_TABLET | Freq: Three times a day (TID) | ORAL | Status: DC | PRN
  Administered 2016-01-27 – 2016-01-28 (×3): 1 mg via ORAL
  Filled 2016-01-27 (×3): qty 1

## 2016-01-27 MED ORDER — TRAZODONE HCL 50 MG PO TABS: 50.0000 mg | ORAL_TABLET | Freq: Every evening | ORAL | Status: DC | PRN

## 2016-01-27 MED ORDER — ACETAMINOPHEN 325 MG PO TABS
650.0000 mg | ORAL_TABLET | ORAL | Status: DC | PRN
  Administered 2016-01-27 – 2016-01-29 (×3): 650 mg via ORAL
  Filled 2016-01-27 (×3): qty 2

## 2016-01-27 MED ORDER — ALUM & MAG HYDROXIDE-SIMETH 200-200-20 MG/5ML PO SUSP: 30.0000 mL | ORAL | Status: DC | PRN

## 2016-01-27 MED ORDER — ONDANSETRON HCL 4 MG PO TABS: 4.0000 mg | ORAL_TABLET | Freq: Three times a day (TID) | ORAL | Status: DC | PRN

## 2016-01-27 MED ORDER — CALCIUM CARBONATE-VITAMIN D 500-200 MG-UNIT PO TABS
1.0000 | ORAL_TABLET | Freq: Every day | ORAL | Status: DC
  Administered 2016-01-28 – 2016-01-30 (×3): 1 via ORAL
  Filled 2016-01-27: qty 7
  Filled 2016-01-27 (×4): qty 1

## 2016-01-27 NOTE — ED Notes (Signed)
Patient noted sleeping in room. No complaints, stable, in no acute distress. Q15 minute rounds and monitoring via Security Cameras to continue.  

## 2016-01-27 NOTE — Tx Team (Signed)
Initial Interdisciplinary Treatment Plan   PATIENT STRESSORS: Medication change or noncompliance panic attacks interfering with daily living   PATIENT STRENGTHS: Ability for insight Capable of independent living Motivation for treatment/growth   PROBLEM LIST: Problem List/Patient Goals Date to be addressed Date deferred Reason deferred Estimated date of resolution  Panic attacks 01/27/16     Depression w/ SI 01/27/16     "stop having panic attacks" 01/27/16     "get medications straight" 01/27/16                                    DISCHARGE CRITERIA:  Improved stabilization in mood, thinking, and/or behavior Motivation to continue treatment in a less acute level of care  PRELIMINARY DISCHARGE PLAN: Outpatient therapy Return to previous living arrangement  PATIENT/FAMIILY INVOLVEMENT: This treatment plan has been presented to and reviewed with the patient.  The patient and family have been given the opportunity to ask questions and make suggestions.  Shelby Bennett Shelby Bennett 01/27/2016, 4:53 PM

## 2016-01-27 NOTE — Progress Notes (Signed)
Writer spoke with patient at medication window and she was tearful reporting that she was in the dayroom watching tv and a commercial about [employer] came on and she became upset b/c of experiences she has had within the system before coming here. Writer inquired if she filled out a patient survey concerning her experiences. She reported that she did and Clinical research associate encouraged her to talk with her doctor on tomorrow and address her concerns and frustrations about her medications and feeling like she is not being helped. Medication given for anxiety, support given and safety maintained on unit with 15 min checks.

## 2016-01-27 NOTE — Progress Notes (Signed)
The focus of this group is to help patients review their daily goal of treatment and discuss progress on daily workbooks.  Patient attended group and participated. Patient states today is her first day here and that she just wants to get her medication regulated before leaving.

## 2016-01-27 NOTE — Progress Notes (Signed)
Admission Note:  D- 25 y.o. female who presents voluntary,for the treatment of anxiety, panic attacks and SI. Patient appears anxious and depressed on admission. Patient was cooperative with admission process.   Patient states that she has noticed that her panic attack symptoms have worsened and her symptoms include; , "heart pounding" "numbness and tingling in the lower legs and feet, inability to focus, dizziness, the need to sit down and be still, breathing but not feeling like I'm getting enough air." "My hearing gets locked down and I can see people's lips moving but can't hear them speaking, I just hear a high pitched tone".  Patient states that she has had increasing depression over the past three months and frequently wishes that "it was all over with. I'm too tired to keep going on". Patient states that she has been suicidal over the past three months and states "usually I can tell when I'm going to be depressed and I come out of it but it's just been worse and worse." Patient denies previous attempts but states that she has had previously plans, although none in the past six months.Patient denies HI and history of aggression. Patient denies AVH and does not appear to be responding to internal stimuli. Patient states that she started Seroquel XR titrating up from 50mg  with 200mg  scheduled for today. She states that this makes her feel very groggy and has not helped her anxiety or depression, that they have gotten worse on the medication. Patient states that her husband is supportive. Patient denies history of trauma/abuse.   A- Skin was assessed and found to be clear of any abnormal marks.  Patient searched and no contraband found, POC and unit policies explained and understanding verbalized. Consents obtained.  .  R- Patient had no additional questions or concerns. Safety maintained on unit.

## 2016-01-27 NOTE — Progress Notes (Signed)
Husband visiting with patient. Patient earlier was calm, cooperative, tentative/almost tearful in manner. She denied SI/HI/AVH and said she felt extremely said. Said she preferred to be called Shelby Bennett but anything was OK as long as she was called "mean names." Emotional support provided. Pt was extremely polite. Urged her to approach staff with needs/concerns.

## 2016-01-27 NOTE — ED Notes (Signed)
Pt was transferred via [facility] to [facility]. Pt had no belongings (had been given to family). Pt signed for transfer; verbalized understanding and appeared in no acute distress. VS WNL. Report had been called to [employer]. Pt's husband told of [facility] address and visiting hours.

## 2016-01-28 DIAGNOSIS — F314 Bipolar disorder, current episode depressed, severe, without psychotic features: Principal | ICD-10-CM

## 2016-01-28 MED ORDER — BUSPIRONE HCL 10 MG PO TABS
10.0000 mg | ORAL_TABLET | Freq: Two times a day (BID) | ORAL | Status: DC
  Administered 2016-01-28 – 2016-01-29 (×2): 10 mg via ORAL
  Filled 2016-01-28 (×3): qty 1
  Filled 2016-01-28: qty 2
  Filled 2016-01-28 (×3): qty 1

## 2016-01-28 MED ORDER — DIVALPROEX SODIUM ER 500 MG PO TB24
500.0000 mg | ORAL_TABLET | Freq: Two times a day (BID) | ORAL | Status: DC
  Administered 2016-01-28 – 2016-01-30 (×4): 500 mg via ORAL
  Filled 2016-01-28 (×3): qty 1
  Filled 2016-01-28: qty 14
  Filled 2016-01-28 (×2): qty 1
  Filled 2016-01-28: qty 14
  Filled 2016-01-28 (×2): qty 1

## 2016-01-28 NOTE — Progress Notes (Signed)
D: Patient's self inventory sheet: patient has good sleep, recieved sleep medication and had trouble waking up.good  Appetite, low energy level, poor concentration. Rated depression 6/10, hopeless 6/10, anxiety 4/10. SI/HI/AVH: Denies. Physical complaints are neck and back pain . Goal is "talk with doctor about a more appropriate medication for me" . Plans to work on  "keep asking when I can see one".   A: Medications administered, assessed medication knowledge and education given on medication regimen.  Emotional support and encouragement given patient. R: Denies SI and HI , contracts for safety. Safety maintained with 15 minute checks.

## 2016-01-28 NOTE — [facility]HH Suicide Risk Assessment (Signed)
[facility] Admission Suicide Risk Assessment   Nursing information obtained from:  Patient Demographic factors:  Adolescent or young adult Current Mental Status:  Suicidal ideation indicated by patient Loss Factors:  NA Historical Factors:  NA Risk Reduction Factors:  Sense of responsibility to family, Employed, Living with another person, especially a relative  Total Time spent with patient: 1 hour Principal Problem: <principal problem not specified> Diagnosis:   Patient Active Problem List   Diagnosis Date Noted  . Bipolar disorder current episode depressed (HCC) [F31.30] 01/27/2016  . Bipolar 1 disorder, depressed, severe (HCC) [F31.4] 01/27/2016  . Bipolar affective disorder, current episode manic (HCC) [F31.9] 10/10/2012    Class: Acute  . Anxiety and depression [F41.8] 04/14/2012  . ADHD (attention deficit hyperactivity disorder) [F90.9] 04/14/2012   Subjective Data: Shelby Bennett is an 25 y.o. female presenting voluntarily for worsening panic attacks. Patient states that she has noticed that her panic attack symptoms have worsened and her symptoms include; "heart pounding, numbness and tingling in the lower legs and feet, inability to focus, dizziness, the need to sit down and be still, breathing but not feeling like I'm getting enough air." Patient states that she started Seroquel XR titrating up from 50mg  with 200mg  scheduled for today. Patient states that she feels that her depression and anxiety has worsened with the medication..  Continued Clinical Symptoms:  Alcohol Use Disorder Identification Test Final Score (AUDIT): 0 The "Alcohol Use Disorders Identification Test", Guidelines for Use in Primary Care, Second Edition.  World Science writer [facility]). Score between 0-7:  no or low risk or alcohol related problems. Score between 8-15:  moderate risk of alcohol related problems. Score between 16-19:  high risk of alcohol related problems. Score 20 or above:  warrants further  diagnostic evaluation for alcohol dependence and treatment.   CLINICAL FACTORS:   Severe Anxiety and/or Agitation Panic Attacks Bipolar Disorder:   Mixed State Depression:   Aggression Anhedonia Impulsivity Insomnia Recent sense of peace/wellbeing Severe Chronic Pain Previous Psychiatric Diagnoses and Treatments Medical Diagnoses and Treatments/Surgeries   Musculoskeletal: Strength & Muscle Tone: within normal limits Gait & Station: normal Patient leans: N/A  Psychiatric Specialty Exam: Physical Exam  Constitutional: She is oriented to person, place, and time. She appears well-developed and well-nourished.  HENT:  Head: Normocephalic.  Eyes: Pupils are equal, round, and reactive to light.  Neck: Normal range of motion.  Cardiovascular: Normal rate and regular rhythm.   Respiratory: Effort normal.  Musculoskeletal: Normal range of motion.  Neurological: She is alert and oriented to person, place, and time.  Skin: Skin is warm.  Psychiatric: Her speech is normal. Judgment normal. Her mood appears anxious. She is agitated. Cognition and memory are normal. She exhibits a depressed mood. She is inattentive.    Review of Systems  All other systems reviewed and are negative.   Blood pressure (!) 117/58, pulse 71, temperature 98.4 F (36.9 C), temperature source Oral, resp. rate 12, height 5\' 9"  (1.753 m), weight 63 kg (139 lb), last menstrual period 01/16/2016.Body mass index is 20.53 kg/m.  General Appearance: Guarded  Eye Contact:  Good  Speech:  Clear and Coherent and Slow  Volume:  Decreased  Mood:  Anxious and Depressed  Affect:  Constricted and Depressed  Thought Process:  Coherent and Goal Directed  Orientation:  Full (Time, Place, and Person)  Thought Content:  WDL  Suicidal Thoughts:  No  Homicidal Thoughts:  No  Memory:  Immediate;   Fair Recent;   Fair Remote;  Fair  Judgement:  Intact  Insight:  Fair  Psychomotor Activity:  Restlessness   Concentration:  Concentration: Fair and Attention Span: Poor  Recall:  Fair  Fund of Knowledge:  Good  Language:  Good  Akathisia:  Negative  Handed:  Right  AIMS (if indicated):     Assets:  Communication Skills Desire for Improvement Financial Resources/Insurance Housing Intimacy Leisure Time Physical Health Resilience Social Support Talents/Skills Transportation Vocational/Educational  ADL's:  Intact  Cognition:  WNL  Sleep:  Number of Hours: 6.75      COGNITIVE FEATURES THAT CONTRIBUTE TO RISK:  Polarized thinking    SUICIDE RISK:   Minimal: No identifiable suicidal ideation.  Patients presenting with no risk factors but with morbid ruminations; may be classified as minimal risk based on the severity of the depressive symptoms   PLAN OF CARE: Admit for increased symptoms of mood swings, depression, insomnia and recent exacerbation of panic episodes which is associated with her starting mood stabilizer Seroquel within the few days. Patient reportedly unable to function at work and at home.  I certify that inpatient services furnished can reasonably be expected to improve the patient's condition.   Leata Mouse, MD 01/28/2016, 1:41 PM

## 2016-01-28 NOTE — H&P (Signed)
Psychiatric Admission Assessment Adult  Patient Identification: Janessa Mickle MRN:  [id number] Date of Evaluation:  01/28/2016 Chief Complaint:  Bipolar Disorder and panic episodes Principal Diagnosis: <principal problem not specified> Diagnosis:   Patient Active Problem List   Diagnosis Date Noted  . Bipolar disorder current episode depressed ([place]) [F31.30] 01/27/2016  . Bipolar 1 disorder, depressed, severe ([place]) [F31.4] 01/27/2016  . Bipolar affective disorder, current episode manic ([place]) [F31.9] 10/10/2012    Class: Acute  . Anxiety and depression [F41.8] 04/14/2012  . ADHD (attention deficit hyperactivity disorder) [F90.9] 04/14/2012   History of Present Illness: Shelby Bennett is an 25 y.o. female  admitted to [facility] from [facility] department for increased symptoms of mood swings, depression, hypomania, insomnia, and for worsening panic attacks.  Reportedly she was seen a outpatient psychiatric provider who started titrating dose of Seroquel Omar Person was taken has guided the package. Reportedly she was not able to function both at home and work secondary to worsening symptoms of mood and anxiety. Patient states that she has noticed that her panic attack symptoms have worsened and her symptoms include; , "heart pounding" "numbness and tingling in the lower legs and feet, inability to focus, dizziness, the need to sit down and be still, breathing but not feeling like I'm getting enough air." Patient states that she has had increasing depression over the past three months and is suicidal with no plan. Patient states that she has been suicidal over the past three months and states "usually I can tell when I'm going to be depressed and I come out of it but it's just been worse and worse." Patient denies previous attempts but states that she has had previously plans, although none in the past six months.Patient denies HI and history of aggression. Patient  denies AVH and does not appear to be responding to internal stimuli. Patient states that she started Seroquel XR titrating up from 1m with 2041mscheduled for today. Patient states that she feels that her depression and anxiety has worsened with the medication.Patient states that her husband is supportive. Patient denies history of trauma/abuse. Associated Signs/Symptoms: Depression Symptoms:  depressed mood, insomnia, psychomotor agitation, fatigue, feelings of worthlessness/guilt, difficulty concentrating, impaired memory, panic attacks, loss of energy/fatigue, decreased labido, decreased appetite, (Hypo) Manic Symptoms:  Distractibility, Elevated Mood, Impulsivity, Irritable Mood, Labiality of Mood, Anxiety Symptoms:  Panic Symptoms, Social Anxiety, Psychotic Symptoms:  He denied auditory/visual hallucinations, delusions and paranoia. PTSD Symptoms: NA Total Time spent with patient: 1 hour  Past Psychiatric History: patient has been suffering with bipolar disorder, attention deficit hyperactivity disorder, social anxiety disorder since age [age]ears old and has a history of 3 previous acute psychiatric hospitalization for suicidal ideations and behaviors in the past.   Is the patient at risk to self? Yes.    Has the patient been a risk to self in the past 6 months? No.  Has the patient been a risk to self within the distant past? Yes.    Is the patient a risk to others? No.  Has the patient been a risk to others in the past 6 months? No.  Has the patient been a risk to others within the distant past? No.   Prior Inpatient Therapy:   Prior Outpatient Therapy:    Alcohol Screening: 1. How often do you have a drink containing alcohol?: Never 9. Have you or someone else been injured as a result of your drinking?: No 10. Has a relative or friend or  a doctor or another health worker been concerned about your drinking or suggested you cut down?: No Alcohol Use Disorder  Identification Test Final Score (AUDIT): 0 Brief Intervention: AUDIT score less than 7 or less-screening does not suggest unhealthy drinking-brief intervention not indicated Substance Abuse History in the last 12 months:  Yes.   Consequences of Substance Abuse: NA Previous Psychotropic Medications: Yes  Psychological Evaluations: Yes  Past Medical History:  Past Medical History:  Diagnosis Date  . ADHD (attention deficit hyperactivity disorder)   . Allergy   . Anxiety   . Arthritis   . Asthma   . Bipolar disorder ([place])   . Celiac disease    ? self diagnosis  . Depression   . Herpes   . Mononucleosis 2012  . Ovarian cyst    left  . PTSD (post-traumatic stress disorder)   . 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 APPENDECTOMY     Family History:  Family History  Problem Relation Age of Onset  . Mental illness Sister     PMDD  . Cervical cancer Sister     s/p ablation  . Mental illness Brother     depression  . Mental illness Brother     depression  . Mental illness Mother     bipolar  . Migraines Father   . Colon cancer Maternal Grandfather   . Esophageal cancer Neg Hx   . Rectal cancer Neg Hx   . Stomach cancer Neg Hx    Family Psychiatric  History: Patient has unknown family history of mental illness.  Tobacco Screening: _0 ([phone])::1)@ Social History:  History  Alcohol Use No     History  Drug Use No    Additional Social History:      Pain Medications: Denies Prescriptions: Denies Over the Counter: Denies History of alcohol / drug use?: No history of alcohol / drug abuse                    Allergies:   Allergies  Allergen Reactions  . 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 Zithromax [Azithromycin Dihydrate] Diarrhea and Nausea And Vomiting  . Latex Swelling  . Margarite Gouge Er]     Suicidal rash  . Lavender Oil Hives   Lab Results:   Results for orders placed or performed during the hospital encounter of 01/26/16 (from the past 48 hour(s))  CBC with Differential     Status: None   Collection Time: 01/26/16  7:02 PM  Result Value Ref Range   WBC 7.3 4.0 - 10.5 K/uL   RBC 4.62 3.87 - 5.11 MIL/uL   Hemoglobin 14.7 12.0 - 15.0 g/dL   HCT 42.8 36.0 - 46.0 %   MCV 92.6 78.0 - 100.0 fL   MCH 31.8 26.0 - 34.0 pg   MCHC 34.3 30.0 - 36.0 g/dL   RDW 12.2 11.5 - 15.5 %   Platelets 195 150 - 400 K/uL   Neutrophils Relative % 62 %   Neutro Abs 4.5 1.7 - 7.7 K/uL   Lymphocytes Relative 30 %   Lymphs Abs 2.2 0.7 - 4.0 K/uL   Monocytes Relative 6 %   Monocytes Absolute 0.5 0.1 - 1.0 K/uL   Eosinophils Relative 1 %   Eosinophils Absolute 0.1 0.0 - 0.7 K/uL   Basophils Relative 1 %   Basophils Absolute 0.1  0.0 - 0.1 K/uL  Comprehensive metabolic panel     Status: Abnormal   Collection Time: 01/26/16  7:02 PM  Result Value Ref Range   Sodium 138 135 - 145 mmol/L   Potassium 4.2 3.5 - 5.1 mmol/L   Chloride 107 101 - 111 mmol/L   CO2 23 22 - 32 mmol/L   Glucose, Bld 97 65 - 99 mg/dL   BUN 11 6 - 20 mg/dL   Creatinine, Ser 0.71 0.44 - 1.00 mg/dL   Calcium 9.4 8.9 - 10.3 mg/dL   Total Protein 7.2 6.5 - 8.1 g/dL   Albumin 4.9 3.5 - 5.0 g/dL   AST 16 15 - 41 U/L   ALT 12 (L) 14 - 54 U/L   Alkaline Phosphatase 35 (L) 38 - 126 U/L   Total Bilirubin 0.8 0.3 - 1.2 mg/dL   GFR calc non Af Amer >60 >60 mL/min   GFR calc Af Amer >60 >60 mL/min    Comment: (NOTE) The eGFR has been calculated using the CKD EPI equation. This calculation has not been validated in all clinical situations. eGFR's persistently <60 mL/min signify possible Chronic Kidney Disease.    Anion gap 8 5 - 15  Salicylate level     Status: None   Collection Time: 01/26/16  7:02 PM  Result Value Ref Range   Salicylate Lvl <3.6 2.8 - 30.0 mg/dL  Acetaminophen level     Status: Abnormal   Collection Time: 01/26/16  7:02 PM  Result Value Ref Range    Acetaminophen (Tylenol), Serum <10 (L) 10 - 30 ug/mL    Comment:        THERAPEUTIC CONCENTRATIONS VARY SIGNIFICANTLY. A RANGE OF 10-30 ug/mL MAY BE AN EFFECTIVE CONCENTRATION FOR MANY PATIENTS. HOWEVER, SOME ARE BEST TREATED AT CONCENTRATIONS OUTSIDE THIS RANGE. ACETAMINOPHEN CONCENTRATIONS >150 ug/mL AT 4 HOURS AFTER INGESTION AND >50 ug/mL AT 12 HOURS AFTER INGESTION ARE OFTEN ASSOCIATED WITH TOXIC REACTIONS.   Ethanol     Status: None   Collection Time: 01/26/16  7:02 PM  Result Value Ref Range   Alcohol, Ethyl (B) <5 <5 mg/dL    Comment:        LOWEST DETECTABLE LIMIT FOR SERUM ALCOHOL IS 5 mg/dL FOR MEDICAL PURPOSES ONLY   I-stat troponin, ED     Status: None   Collection Time: 01/26/16  7:09 PM  Result Value Ref Range   Troponin i, poc 0.00 0.00 - 0.08 ng/mL   Comment 3            Comment: Due to the release kinetics of cTnI, a negative result within the first hours of the onset of symptoms does not rule out myocardial infarction with certainty. If myocardial infarction is still suspected, repeat the test at appropriate intervals.   I-Stat beta hCG blood, ED ([facility], [facility], [facility] only)     Status: None   Collection Time: 01/26/16  7:09 PM  Result Value Ref Range   I-stat hCG, quantitative <5.0 <5 mIU/mL   Comment 3            Comment:   GEST. AGE      CONC.  (mIU/mL)   <=1 WEEK        5 - 50     2 WEEKS       50 - 500     3 WEEKS       100 - 10,000     4 WEEKS     1,000 - 30,000  FEMALE AND NON-PREGNANT FEMALE:     LESS THAN 5 mIU/mL   Urine rapid drug screen (hosp performed)     Status: None   Collection Time: 01/26/16  7:30 PM  Result Value Ref Range   Opiates NONE DETECTED NONE DETECTED   Cocaine NONE DETECTED NONE DETECTED   Benzodiazepines NONE DETECTED NONE DETECTED   Amphetamines NONE DETECTED NONE DETECTED   Tetrahydrocannabinol NONE DETECTED NONE DETECTED   Barbiturates NONE DETECTED NONE DETECTED    Comment:        DRUG SCREEN FOR MEDICAL  PURPOSES ONLY.  IF CONFIRMATION IS NEEDED FOR ANY PURPOSE, NOTIFY LAB WITHIN 5 DAYS.        LOWEST DETECTABLE LIMITS FOR URINE DRUG SCREEN Drug Class       Cutoff (ng/mL) Amphetamine      1000 Barbiturate      200 Benzodiazepine   944 Tricyclics       967 Opiates          300 Cocaine          300 THC              50     Blood Alcohol level:  Lab Results  Component Value Date   ETH <5 01/26/2016   ETH <11 59/16/3846    Metabolic Disorder Labs:  No results found for: HGBA1C, MPG No results found for: PROLACTIN No results found for: CHOL, TRIG, HDL, CHOLHDL, VLDL, LDLCALC  Current Medications: Current Facility-Administered Medications  Medication Dose Route Frequency Provider Last Rate Last Dose  . acetaminophen (TYLENOL) tablet 650 mg  650 mg Oral Q4H PRN Lurena Nida, NP   650 mg at 01/27/16 2102  . alum & mag hydroxide-simeth (MAALOX/MYLANTA) 200-200-20 MG/5ML suspension 30 mL  30 mL Oral PRN Lurena Nida, NP      . calcium-vitamin D (OSCAL WITH D) 500-200 MG-UNIT per tablet 1 tablet  1 tablet Oral Q breakfast Lurena Nida, NP   1 tablet at 01/28/16 1013  . LORazepam (ATIVAN) tablet 1 mg  1 mg Oral Q8H PRN Lurena Nida, NP   1 mg at 01/28/16 1016  . magnesium oxide (MAG-OX) tablet 400 mg  400 mg Oral Daily Lurena Nida, NP   400 mg at 01/28/16 1013  . ondansetron (ZOFRAN) tablet 4 mg  4 mg Oral Q8H PRN Lurena Nida, NP      . QUEtiapine (SEROQUEL XR) 24 hr tablet 300 mg  300 mg Oral QHS Lurena Nida, NP   300 mg at 01/27/16 2100  . traZODone (DESYREL) tablet 50 mg  50 mg Oral QHS PRN Lurena Nida, NP       PTA Medications: Prescriptions Prior to Admission  Medication Sig Dispense Refill Last Dose  . albuterol (PROVENTIL HFA;VENTOLIN HFA) 108 (90 BASE) MCG/ACT inhaler Inhale 1-2 puffs into the lungs every 6 (six) hours as needed for wheezing or shortness of breath. 1 Inhaler 0 Unknown at Unknown time  . ALPRAZolam (XANAX) 0.5 MG tablet Take 0.5 mg by mouth at  bedtime as needed for anxiety.   Unknown at Unknown time  . amoxicillin (AMOXIL) 500 MG capsule Take 1 capsule (500 mg total) by mouth 2 (two) times daily. (Patient not taking: Reported on 01/26/2016) 20 capsule 0 Unknown at Unknown time  . Calcium Carb-Cholecalciferol (CALCIUM-VITAMIN D) 600-400 MG-UNIT TABS Take 1 tablet by mouth daily.   Unknown at Unknown time  . ibuprofen (ADVIL,MOTRIN) 800 MG tablet Take 1  tablet (800 mg total) by mouth 3 (three) times daily. (Patient not taking: Reported on 01/26/2016) 30 tablet 0 Unknown at Unknown time  . Magnesium 300 MG CAPS Take 1 capsule by mouth daily.   Unknown at Unknown time  . mometasone (NASONEX) 50 MCG/ACT nasal spray Place 2 sprays into the nose daily. (Patient not taking: Reported on 01/26/2016) 17 g 0 Unknown at Unknown time  . predniSONE (DELTASONE) 50 MG tablet Take 1 pill daily for 5 days. (Patient not taking: Reported on 01/26/2016) 5 tablet 0 Unknown at Unknown time  . Pseudoephedrine-Guaifenesin (MUCINEX D) [phone] MG TB12 Take 1 tablet by mouth 2 (two) times daily as needed (congestion). (Patient not taking: Reported on 01/26/2016) 20 each 0 Unknown at Unknown time  . QUEtiapine (SEROQUEL XR) 50 MG TB24 24 hr tablet Take 50-300 mg by mouth as directed. 14 Day Course: Take 50 mg on Day 1, Take 100 mg on Day 2, Take 200 mg on Day 3 and Take 300 mg from Days 4-14.   Unknown at Unknown time    Musculoskeletal:  Psychiatric Specialty Exam: Physical Exam  ROS  Blood pressure (!) 117/58, pulse 71, temperature 98.4 F (36.9 C), temperature source Oral, resp. rate 12, height _0  (1.753 m), weight 63 kg (139 lb), last menstrual period 01/16/2016.Body mass index is 20.53 kg/m.       Treatment Plan Summary: Daily contact with patient to assess and evaluate symptoms and progress in treatment and Medication management  Observation Level/Precautions:  15 minute checks  Laboratory:  Reviewed admission labs  Psychotherapy:  Milieu therapy and  group therapy   Medications:  Discontinue Seroquel secondary to adverse effects including blurred vision and unable to get up from the bed due to excessive sedation and we start Depakote 500 mg twice daily and BuSpar 10 mg twice daily and Ativan 1 mg 3 times daily as needed   Consultations:  As needed   Discharge Concerns:  Safety  Estimated LOS:5-7 days   Other:     I certify that inpatient services furnished can reasonably be expected to improve the patient's condition.    Ambrose Finland, MD 7/23/20171:50 PM

## 2016-01-28 NOTE — [facility] Group Notes (Signed)
[facility] Group Notes:  (Clinical Social Work)   01/28/16    1:15-2:15PM  Summary of Progress/Problems:   The main focus of today's process group was to   1)  discuss the importance of adding supports  2)  define health supports versus unhealthy supports  3)  Identify some healthy supports to add  An emphasis was placed on using counselor, doctor, therapy groups, 12-step groups, and problem-specific support groups to expand supports.  We also discussed what patients now wish they could say to someone in their life who either withdrew their support or provided only unhealthy support to them.  The patient expressed full comprehension of the concepts presented and participated appropriately.  The patient stated little during group, but did express anger at supports she has asked to stay out of her life because they would not do so.  Type of Therapy:  Process Group with Motivational Interviewing  Participation Level:  Minimal  Participation Quality:  Attentive  Affect:  Flat and Irritable  Cognitive:  Appropriate  Insight:  Improving  Engagement in Therapy:  Developing  Modes of Intervention:   Education, Support and Processing, Activity  [employer], LCSW. 01/28/16   3:04 PM

## 2016-01-28 NOTE — Progress Notes (Signed)
Adult Psychoeducational Group Note  Date:  01/28/2016 Time:  8:35 PM Adult Psychoeducational Group Note  Date:  01/28/2016 Time:  8:35 PM  Group Topic/Focus:  Wrap-Up Group:   The focus of this group is to help patients review their daily goal of treatment and discuss progress on daily workbooks.   Participation Level:  Active  Participation Quality:  Appropriate  Affect:  Appropriate  Cognitive:  Alert  Insight: Appropriate  Engagement in Group:  Engaged  Modes of Intervention:  Discussion  Additional Comments:  On a scale between 1-10, (1=worse, 10=best) patient rated her day an 8. Patient goal for today was to get medication changed. Patient met goal.  Shelby Bennett 01/28/2016, 8:35 PM

## 2016-01-29 DIAGNOSIS — F410 Panic disorder [episodic paroxysmal anxiety] without agoraphobia: Secondary | ICD-10-CM

## 2016-01-29 MED ORDER — IBUPROFEN 800 MG PO TABS
800.0000 mg | ORAL_TABLET | Freq: Three times a day (TID) | ORAL | Status: DC | PRN
  Administered 2016-01-29: 800 mg via ORAL
  Filled 2016-01-29: qty 1

## 2016-01-29 MED ORDER — LORAZEPAM 1 MG PO TABS
1.0000 mg | ORAL_TABLET | Freq: Three times a day (TID) | ORAL | Status: DC | PRN
  Administered 2016-01-29 – 2016-01-30 (×2): 1 mg via ORAL
  Filled 2016-01-29 (×2): qty 1

## 2016-01-29 MED ORDER — FLUOXETINE HCL 10 MG PO CAPS
10.0000 mg | ORAL_CAPSULE | Freq: Every day | ORAL | Status: DC
  Administered 2016-01-30: 10 mg via ORAL
  Filled 2016-01-29: qty 7
  Filled 2016-01-29 (×2): qty 1

## 2016-01-29 NOTE — Plan of Care (Signed)
Problem: Medication: Goal: Compliance with prescribed medication regimen will improve Outcome: Progressing Patient is med compliant   

## 2016-01-29 NOTE — Progress Notes (Signed)
D:Patient up and visible in the milieu. Spoke with patient 1:1. Rates sleep, appetite, energy and concentration as "good." Patient's affect flat, mood depressed. Rating her depression at a 1/10, hopelessness at a 0/10 and anxiety at a 3/10. States goal for today is to "touch base on medication and begin exit plan." Complaining of neck pain of a 5/10, requesting tylenol prn. Patient asking about discharge however states, "this is the first day I've felt better in months."   A: Medicated per orders, tylenol given. Emotional support offered and self inventory reviewed. Encouraged patient to consider discharge may be premature as she is only now beginning to see improvement.   R: Patient verbalizes understanding. On reassess, patient's pain resolved. Patient denies SI/HI and remains safe on level III obs.

## 2016-01-29 NOTE — Progress Notes (Signed)
D: client is visible on the unit, seen with visitors in her room, receiving a back rub. Client reports goal as "medication change" "I got it" "I take Ativan for sleep" "I take the Ativan for the racing thoughts so I can sleep"  client reports problems with "hard stools" "but I've had one and it's soft now" A: Writer provided emotional support, encouraged water to help soften stools, offered a mild laxative(MOM). Staff will monitor q60min for safety. R: Client is safe on the unit, attended group.

## 2016-01-29 NOTE — [facility] Group Notes (Signed)
[facility] LCSW Group Therapy  01/29/2016 1:15pm  Type of Therapy:  Group Therapy vercoming Obstacles  Participation Level:  Active  Participation Quality:  Appropriate   Affect:  Appropriate  Cognitive:  Appropriate and Oriented  Insight:  Developing/Improving and Improving  Engagement in Therapy:  Improving  Modes of Intervention:  Discussion, Exploration, Problem-solving and Support  Description of Group:   In this group patients will be encouraged to explore what they see as obstacles to their own wellness and recovery. They will be guided to discuss their thoughts, feelings, and behaviors related to these obstacles. The group will process together ways to cope with barriers, with attention given to specific choices patients can make. Each patient will be challenged to identify changes they are motivated to make in order to overcome their obstacles. This group will be process-oriented, with patients participating in exploration of their own experiences as well as giving and receiving support and challenge from other group members.  Summary of Patient Progress: Pt was active in group discussion; she expressed frustration at the difficulty she had finding help in the community for the last 2 weeks after a severe panic attack. She reports that this is a primary obstacle in her life. She was able to identify with other peers when they shared obstacles and offered suggestions.   Therapeutic Modalities:   Cognitive Behavioral Therapy Solution Focused Therapy Motivational Interviewing Relapse Prevention Therapy   Chad Cordial, LCSWA 01/29/2016 4:27 PM

## 2016-01-29 NOTE — Tx Team (Addendum)
Interdisciplinary Treatment Plan Update (Adult) Date: 01/29/2016   Date: 01/29/2016 8:38 AM  Progress in Treatment:  Attending groups: Pt is new to milieu, continuing to assess  Participating in groups: Pt is new to milieu, continuing to assess  Taking medication as prescribed: Yes  Tolerating medication: Yes  Family/Significant other contact made: Yes, CSW has spoken with patient's husband Patient understands diagnosis: Continuing to assess Discussing patient identified problems/goals with staff: Yes  Medical problems stabilized or resolved: Yes  Denies suicidal/homicidal ideation: Yes Patient has not harmed self or Others: Yes   New problem(s) identified: None identified at this time.   Discharge Plan or Barriers: Pt will return home and follow-up with outpatient services.   Additional comments:  Patient and CSW reviewed pt's identified goals and treatment plan. Patient verbalized understanding and agreed to treatment plan.   Reason for Continuation of Hospitalization:  Anxiety Depression Medication stabilization Suicidal ideation  Estimated length of stay: 1-3 days  Review of initial/current patient goals per problem list:   1.  Goal(s): Patient will participate in aftercare plan  Met:  Yes  Target date: 3-5 days from date of admission   As evidenced by: Patient will participate within aftercare plan AEB aftercare provider and housing plan at discharge being identified.   01/29/16: Pt will return home and follow-up with outpatient services.   2.  Goal (s): Patient will exhibit decreased depressive symptoms and suicidal ideations.  Met:  Yes  Target date: 3-5 days from date of admission   As evidenced by: Patient will utilize self rating of depression at 3 or below and demonstrate decreased signs of depression or be deemed stable for discharge by MD.  01/29/16: Pt rates depression at 2/10; denies SI  3.  Goal(s): Patient will demonstrate decreased signs and symptoms  of anxiety.  Met:  Adequate for discharge per MD  Target date: 3-5 days from date of admission   As evidenced by: Patient will utilize self rating of anxiety at 3 or below and demonstrated decreased signs of anxiety, or be deemed stable for discharge by MD  01/29/16: Pt rates anxiety at 4/10  7/25: Adequate for discharge per MD. Patient reports improvement in her symptoms, reports feeling safe for discharge home today.   Attendees:  Patient:    Family:    Physician: Dr. Parke Poisson, MD  01/29/2016 8:38 AM  Nursing: Loletta Specter, RN; Gaylan Gerold, RN 01/29/2016 8:38 AM  Clinical Social Worker Peri Maris, Latanya Presser 01/29/2016 8:38 AM  Other:  01/29/2016 8:38 AM  Clinical: Lars Pinks, RN Case manager  01/29/2016 8:38 AM  Other:  01/29/2016 8:38 AM  Other:     Peri Maris, [place] Social Work [phone]

## 2016-01-29 NOTE — Progress Notes (Signed)
Recreation Therapy Notes  Date: 01/29/16 Time: 0930 Location: [address] Group Room  Group Topic: Stress Management  Goal Area(s) Addresses:  Patient will verbalize importance of using healthy stress management.  Patient will identify positive emotions associated with healthy stress management.   Behavioral Response: Engaged  Intervention: Stress Management  Activity :  Guided Imagery.  LRT introduced and educated patients on stress management technique of guided imagery.  A script was used to deliver to the technique to patients.  Patients were asked to follow the script read allowed by LRT to engage in practicing the technique.  Education:  Stress Management, Discharge Planning.   Education Outcome: Acknowledges edcuation/In group clarification offered/Needs additional education  Clinical Observations/Feedback: Pt was engaged and stated it felt like "getting a massage".   Lusero Nordlund [place], LRT/CTRS
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
## 2016-01-29 NOTE — Progress Notes (Signed)
Writer spoke with patient 1:1 in her room. She was observed sitting on her bed reporting that she was making a list of different places she would like to work.  She was pleased that her medications had been changed and is hopeful that they will work for her. She has been isolative to her room other than attending group this evening. She was having difficulty resting and received ativan. Her husband visited on this evening. She denies si/hi/a/v hallucinations. Support given and safety maintained on unit with 15 min checks.
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
## 2016-01-29 NOTE — Progress Notes (Signed)
Adult Psychoeducational Group Note  Date:  01/29/2016 Time:  9:57 PM  Group Topic/Focus: Wrap up group   Participation Level:  Active  Participation Quality:  Appropriate  Affect:  Appropriate  Cognitive:  Alert  Insight: Appropriate  Engagement in Group:  Engaged  Modes of Intervention:  Discussion  Additional Comments:  On a scale between 1-10, (1=worse, 10=best) patient rated her day a 9. Patient states, "my medication is working great for me".  Assia Meanor L Mykale Gandolfo 01/29/2016, 9:57 PM

## 2016-01-30 MED ORDER — FLUOXETINE HCL 10 MG PO CAPS: 10.0000 mg | ORAL_CAPSULE | Freq: Every day | ORAL | 0 refills | Status: DC

## 2016-01-30 MED ORDER — DIVALPROEX SODIUM ER 500 MG PO TB24: 500.0000 mg | ORAL_TABLET | Freq: Two times a day (BID) | ORAL | 0 refills | Status: DC

## 2016-01-30 MED ORDER — RAMELTEON 8 MG PO TABS: 8.0000 mg | ORAL_TABLET | Freq: Every day | ORAL | 0 refills | Status: DC

## 2016-01-30 MED ORDER — LORAZEPAM 1 MG PO TABS: 1.0000 mg | ORAL_TABLET | Freq: Three times a day (TID) | ORAL | 0 refills | Status: DC | PRN

## 2016-01-30 MED ORDER — IBUPROFEN 800 MG PO TABS: 800.0000 mg | ORAL_TABLET | Freq: Three times a day (TID) | ORAL | 0 refills | Status: AC

## 2016-01-30 MED ORDER — RAMELTEON 8 MG PO TABS
8.0000 mg | ORAL_TABLET | Freq: Every day | ORAL | Status: DC
  Filled 2016-01-30: qty 7
  Filled 2016-01-30: qty 1

## 2016-01-30 NOTE — Progress Notes (Signed)
  [facility] Adult Case Management Discharge Plan :  Will you be returning to the same living situation after discharge:  Yes,  patient plans to return home At discharge, do you have transportation home?: Yes,  husband will pick up Do you have the ability to pay for your medications: Yes,  patient will be provided with prescriptions at discharge  Release of information consent forms completed and in the chart;  Patient's signature needed at discharge.  Patient to Follow up at: Follow-up Information    [facility] in [place] .   Why:  7/31 at 8:45am for therapy with Lauren. 8/8 at 8:30am for medication management.  Contact information: [address], [address] [place] [address] Phone: [phone] Fax: [phone]          Next level of care provider has access to [facility] Link:no  Safety Planning and Suicide Prevention discussed: Yes,  with patient and husband  Have you used any form of tobacco in the last 30 days? (Cigarettes, Smokeless Tobacco, Cigars, and/or Pipes): No  Has patient been referred to the Quitline?: N/A patient is not a smoker  Patient has been referred for addiction treatment: Yes  Nicolina Hirt, [place] 01/30/2016, 10:57 AM

## 2016-01-30 NOTE — BHH Group Notes (Signed)
B[facility] Group Notes:  (Nursing/MHT/Case Management/Adjunct) Type of Therapy:  Psychoeducational Skills  Participation Level:  Did Not Attend  Participation Quality:  DID NOT ATTEND  Affect:  DID NOT ATTEND  Cognitive:  DID NOT ATTEND  Insight:  None  Engagement in Group:  DID NOT ATTEND  Modes of Intervention:  DID NOT ATTEND  Summary of Progress/Problems: Pt did not attend patient self inventory group.   Bethann Punches 01/30/2016, 1:03 PM

## 2016-01-30 NOTE — Progress Notes (Signed)
Pt discharged home with home with her husband. Pt was ambulatory, stable at the time. , All papers and prescriptions were given and valuables returned. [place]  understanding expressed. Denies SI/HI and A/VH. Pt given opportunity to express concerns and asked questions.

## 2016-01-30 NOTE — BHH Group Notes (Signed)
Pt attended spiritual care group on grief and loss facilitated by chaplain Gilles Trimpe   Group opened with brief discussion and psycho-social ed around grief and loss.  Group identifying life patterns, circumstances, changes connected to loss in relationship and in relation to self. Established group norm of speaking from own life experience. Group goal of establishing open and affirming space for members to share loss and experience with grief, normalize grief experience and provide psycho social education and grief support. Group facilitation draws on narrative, Adlerian, and brief CBT 

## 2016-01-30 NOTE — Discharge Summary (Signed)
Physician Discharge Summary Note  Patient:  Shelby Bennett is an 25 y.o., female MRN:  [id number] DOB:  August 27, 1990 Patient phone:  [phone] (home)  Patient address:   [address] [place] [address],  Total Time spent with patient: Greater than 30 minutes  Date of Admission:  01/27/2016  Date of Discharge: 01-30-16  Reason for Admission: Worsening symptoms of Bipolar disorder  Principal Problem: Bipolar affective disorder, current episode, depressed.  Discharge Diagnoses: Patient Active Problem List   Diagnosis Date Noted  . Bipolar affective disorder, current episode manic (HCC) [F31.9] 10/10/2012    Priority: High    Class: Acute  . Bipolar disorder current episode depressed (HCC) [F31.30] 01/27/2016  . Bipolar 1 disorder, depressed, severe (HCC) [F31.4] 01/27/2016  . Anxiety and depression [F41.8] 04/14/2012  . ADHD (attention deficit hyperactivity disorder) [F90.9] 04/14/2012   Past Psychiatric History:   Past Medical History:  Past Medical History:  Diagnosis Date  . ADHD (attention deficit hyperactivity disorder)   . Allergy   . Anxiety   . Arthritis   . Asthma   . Bipolar disorder (HCC)   . Celiac disease    ? self diagnosis  . Depression   . Herpes   . Mononucleosis 2012  . Ovarian cyst    left  . PTSD (post-traumatic stress disorder)   . 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 APPENDECTOMY     Family History:  Family History  Problem Relation Age of Onset  . Mental illness Sister     PMDD  . Cervical cancer Sister     s/p ablation  . Mental illness Brother     depression  . Mental illness Brother     depression  . Mental illness Mother     bipolar  . Migraines Father   . Colon cancer Maternal Grandfather   . Esophageal cancer Neg Hx   . Rectal cancer Neg Hx   . Stomach cancer Neg Hx    Family Psychiatric  History: See H&P  Social History:  History   Alcohol Use No     History  Drug Use No    Social History   Social History  . Marital status: Married    Spouse name: n/a  . Number of children: 0  . Years of education: 12+   Occupational History  . massage therapist     [employer]   Social History Main Topics  . Smoking status: Current Every Day Smoker    Packs/day: 0.70    Years: 7.00    Types: Cigarettes    Last attempt to quit: 08/29/2011  . Smokeless tobacco: Never Used  . Alcohol use No  . Drug use: No  . Sexual activity: Yes    Birth control/ protection: None   Other Topics Concern  . 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   Hospital Course: Jacquelineis a 25 y.o.female admitted to [facility] from [place] [facility] emergency department for increased symptoms of mood swings, depression, hypomania, insomnia, and for worsening panic attacks.  Reportedly she was seen a outpatient psychiatric provider who started titrating dose of Seroquel XR which was taken has guided the package. Reportedly she was not able to function both at home and work secondary to worsening symptoms of mood and anxiety. Patient states that she has noticed that her panic  attack symptoms have worsened and her symptoms include; , "heart pounding" "numbness and tingling in the lower legs and feet, inability to focus, dizziness, the need to sit down and be still, breathing but not feeling like I'm getting enough air." Patient states that she has had increasing depression over the past three months and is suicidal with no plan.   [name] was admitted to the [facility] adult unit for worsening symptoms of Bipolar  disorder, most recent episode, depressed. She was presenting with increased mood swings, hypomania, insomnia & panic attacks. She was apparently being seen by her outpatient provider & her medication regimen was being titrated. However, she reported not being to function at work or home due to high  anxiety levels. [name] required mood stabilization treatment. She was then medicated & discharged on; Depakote 500 mg for mood stabilization, Fluoxetine 10 mg for depression, Lorazepam 1 mg prn for anxiety & Ramelton 8 mg for insomnia. Her other pre-existing medical problems were identified and treated by resuming her pertinent home medications for those health issues. She tolerated her treatment regimen without any adverse effects or reactions reported.  During the course of her treatment, [name]'s progress was monitored by observation & her daily report of symptom reduction noted. Her emotional & mental status were assessed & monitored by daily self-inventory reports completed by her & the clinical staff. She was also evaluated daily by the treatment team for mood stability and plans for continued recovery after discharge. [name]'s motivation was an integral factor her mood stability. She was offered further treatment options upon discharge on outpatient basis as listed below. She was provided with all the necessary information needed to make this appointment without problems. Upon discharge, she was both mentally & medically stable. She is adamantly denying suicidal/homicidal ideation, auditory/visual/tactile hallucinations, delusional thoughts & or paranoia. She left [facility] with all personal belongings in no apparent distress. Transportation per husband.  Physical Findings: AIMS: Facial and Oral Movements Muscles of Facial Expression: None, normal Lips and Perioral Area: None, normal Jaw: None, normal Tongue: None, normal,Extremity Movements Upper (arms, wrists, hands, fingers): None, normal Lower (legs, knees, ankles, toes): None, normal, Trunk Movements Neck, shoulders, hips: None, normal, Overall Severity Severity of abnormal movements (highest score from questions above): None, normal Incapacitation due to abnormal movements: None, normal Patient's awareness of abnormal movements  (rate only patient's report): No Awareness, Dental Status Current problems with teeth and/or dentures?: No Does patient usually wear dentures?: No  CIWA:    COWS:     Musculoskeletal: Strength & Muscle Tone: within normal limits Gait & Station: normal Patient leans: N/A  Psychiatric Specialty Exam: Physical Exam  Constitutional: She appears well-developed and well-nourished.  HENT:  Head: Normocephalic.  Eyes: Pupils are equal, round, and reactive to light.  Neck: Normal range of motion.  Cardiovascular: Normal rate.   Respiratory: Effort normal.  GI: Soft.  Genitourinary:  Genitourinary Comments: Denies any issues in this area  Musculoskeletal: Normal range of motion.  Neurological: She is alert.  Skin: Skin is warm and dry.    Review of Systems  Constitutional: Negative.   HENT: Negative.   Eyes: Negative.   Respiratory: Negative.   Cardiovascular: Negative.   Gastrointestinal: Negative.   Genitourinary: Negative.   Musculoskeletal: Negative.   Skin: Negative.   Neurological: Negative.   Endo/Heme/Allergies: Negative.   Psychiatric/Behavioral: Positive for depression (Stable). Negative for hallucinations, memory loss and suicidal ideas. The patient has insomnia (Stable). The patient is not nervous/anxious.     Blood  pressure (!) 107/59, pulse 89, temperature 98.6 F (37 C), temperature source Oral, resp. rate 16, height 5\' 9"  (1.753 m), weight 63 kg (139 lb), last menstrual period 01/16/2016.Body mass index is 20.53 kg/m.  See Md's SRA  Have you used any form of tobacco in the last 30 days? (Cigarettes, Smokeless Tobacco, Cigars, and/or Pipes): No  Has this patient used any form of tobacco in the last 30 days? (Cigarettes, Smokeless Tobacco, Cigars, and/or Pipes):  No  Blood Alcohol level:  Lab Results  Component Value Date   ETH <5 01/26/2016   ETH <11 02/21/2011   Metabolic Disorder Labs:  No results found for: HGBA1C, MPG No results found for:  PROLACTIN No results found for: CHOL, TRIG, HDL, CHOLHDL, VLDL, LDLCALC  See Psychiatric Specialty Exam and Suicide Risk Assessment completed by Attending Physician prior to discharge.  Discharge destination:  Home  Is patient on multiple antipsychotic therapies at discharge:  No   Has Patient had three or more failed trials of antipsychotic monotherapy by history:  No  Recommended Plan for Multiple Antipsychotic Therapies: NA    Medication List    STOP taking these medications   albuterol 108 (90 Base) MCG/ACT inhaler Commonly known as:  PROVENTIL HFA;VENTOLIN HFA   ALPRAZolam 0.5 MG tablet Commonly known as:  XANAX   amoxicillin 500 MG capsule Commonly known as:  AMOXIL   Magnesium 300 MG Caps   mometasone 50 MCG/ACT nasal spray Commonly known as:  NASONEX   predniSONE 50 MG tablet Commonly known as:  DELTASONE   Pseudoephedrine-Guaifenesin [phone] MG Tb12 Commonly known as:  MUCINEX D   SEROQUEL XR 50 MG Tb24 24 hr tablet Generic drug:  QUEtiapine     TAKE these medications     Indication  Calcium-Vitamin D 600-400 MG-UNIT Tabs Take 1 tablet by mouth daily.  Indication:  Low Amount of Calcium in the Blood, Bone health   divalproex 500 MG 24 hr tablet Commonly known as:  DEPAKOTE ER Take 1 tablet (500 mg total) by mouth 2 (two) times daily. For mood stabilization  Indication:  Mood stabilization   FLUoxetine 10 MG capsule Commonly known as:  PROZAC Take 1 capsule (10 mg total) by mouth daily. For depression  Indication:  Major Depressive Disorder   ibuprofen 800 MG tablet Commonly known as:  ADVIL,MOTRIN Take 1 tablet (800 mg total) by mouth 3 (three) times daily. For moderate pain What changed:  additional instructions  Indication:  Moderate pain   LORazepam 1 MG tablet Commonly known as:  ATIVAN Take 1 tablet (1 mg total) by mouth every 8 (eight) hours as needed for anxiety or sleep (agitation).  Indication:  Agitation, Insomnia   ramelteon 8  MG tablet Commonly known as:  ROZEREM Take 1 tablet (8 mg total) by mouth at bedtime. For sleep  Indication:  Trouble Sleeping      Follow-up Information    [facility] [place] .   Why:  7/31 at 8:45am for therapy with Lauren. 8/8 at 8:30am for medication management.  Contact information: [address] [place] [address] Phone: [phone] Fax: [phone]         Follow-up recommendations: Activity:  As tolerated Diet: As recommended by your primary care doctor. Keep all scheduled follow-up appointments as recommended.   Comments: Patient is instructed prior to discharge to: Take all medications as prescribed by his/her mental healthcare provider. Report any adverse effects and or reactions from the medicines to his/her outpatient provider promptly.  Patient has been instructed & cautioned: To not engage in alcohol and or illegal drug use while on prescription medicines. In the event of worsening symptoms, patient is instructed to call the crisis hotline, 911 and or go to the nearest ED for appropriate evaluation and treatment of symptoms. To follow-up with his/her primary care provider for your other medical issues, concerns and or health care needs.   Signed: Sanjuana Kava, NP, PMHNP, FNP-BC 01/31/2016, 4:43 PM  Patient seen, Suicide Assessment Completed.  Disposition Plan Reviewed

## 2016-01-30 NOTE — [facility] Suicide Risk Assessment (Signed)
[facility] INPATIENT:  Family/Significant Other Suicide Prevention Education  Suicide Prevention Education:  Education Completed; husband Beza Bartley [phone],  (name of family member/significant other) has been identified by the patient as the family member/significant other with whom the patient will be residing, and identified as the person(s) who will aid the patient in the event of a mental health crisis (suicidal ideations/suicide attempt).  With written consent from the patient, the family member/significant other has been provided the following suicide prevention education, prior to the and/or following the discharge of the patient.  The suicide prevention education provided includes the following:  Suicide risk factors  Suicide prevention and interventions  National Suicide Hotline telephone number  [facility] assessment telephone number  [facility] Emergency Assistance 911  [facility] and/or Residential Mobile Crisis Unit telephone number  Request made of family/significant other to:  Remove weapons (e.g., guns, rifles, knives), all items previously/currently identified as safety concern.    Remove drugs/medications (over-the-counter, prescriptions, illicit drugs), all items previously/currently identified as a safety concern.  The family member/significant other verbalizes understanding of the suicide prevention education information provided.  The family member/significant other agrees to remove the items of safety concern listed above.  Alois Colgan, [place] 01/30/2016, 10:54 AM

## 2016-01-30 NOTE — [facility] Suicide Risk Assessment (Addendum)
[facility] Discharge Suicide Risk Assessment   Principal Problem: <principal problem not specified> Discharge Diagnoses:  Patient Active Problem List   Diagnosis Date Noted  . Bipolar disorder current episode depressed (HCC) [F31.30] 01/27/2016  . Bipolar 1 disorder, depressed, severe (HCC) [F31.4] 01/27/2016  . Bipolar affective disorder, current episode manic (HCC) [F31.9] 10/10/2012    Class: Acute  . Anxiety and depression [F41.8] 04/14/2012  . ADHD (attention deficit hyperactivity disorder) [F90.9] 04/14/2012    Total Time spent with patient: 30 minutes  Musculoskeletal: Strength & Muscle Tone: within normal limits Gait & Station: normal Patient leans: N/A  Psychiatric Specialty Exam: Review of Systems  Neurological: Positive for seizures.    Blood pressure (!) 107/59, pulse 89, temperature 98.6 F (37 C), temperature source Oral, resp. rate 16, height  (1.753 m), weight 139 lb (63 kg), last menstrual period 01/16/2016.Body mass index is 20.53 kg/m.  General Appearance: improved grooming   Eye Contact::  Good  Speech:  Normal Rate409  Volume:  Normal  Mood:  improved, and states not feeling depressed at this time  Affect:  appropriate, reactive   Thought Process:  Linear  Orientation:  Full (Time, Place, and Person)  Thought Content:  no hallucinations, no delusions, not internally preoccupied   Suicidal Thoughts:  No denies  Any suicidal or self injurious ideations   Homicidal Thoughts:  No denies any homicidal or violent ideations   Memory:  recent and remote grossly intact   Judgement:  Improved   Insight:  improved   Psychomotor Activity:  EPS  Concentration:  Good  Recall:  Good  Fund of Knowledge:Negative  Language: Good  Akathisia:  Negative  Handed:  Right  AIMS (if indicated):     Assets:  Communication Skills Desire for Improvement Resilience  Sleep:  Number of Hours: 5.5  Cognition: WNL  ADL's:  Intact   Mental Status Per Nursing Assessment::    On Admission:  Suicidal ideation indicated by patient  Demographic Factors:  25 year old married female, no children, employed  Loss Factors: Recent medication changes, father becoming demented and actively abusing drugs   Historical Factors: Has been diagnosed with Bipolar Disorder and with Anxiety   Risk Reduction Factors:   Living with another person, especially a relative, Positive social support and Positive coping skills or problem solving skills  Continued Clinical Symptoms:  At this time patient is improved , reports feeling better, and today minimizes depression. Mood presents improved, with reactive, appropriate affect, no thought disorder, no SI or HI, no psychotic symptoms, future oriented, for example, spoke about how her husband and her are thinking of relocating to [place], [place]. Denies medication side effects- states she has been on both fluoxetine and valproate in the past without side effects We reviewed medication side effects to include teratogenic risk associated with Depakote, and other potential serious side effects. Patient reports some ongoing insomnia- states Trazodone, Benadryl, Ambien have been tried but not effective- we discussed options and expressed interest in Rozerem- side effects discussed   Cognitive Features That Contribute To Risk:  No gross cognitive deficits noted upon discharge. Is alert , attentive, and oriented x 3    Suicide Risk:  Mild:  Suicidal ideation of limited frequency, intensity, duration, and specificity.  There are no identifiable plans, no associated intent, mild dysphoria and related symptoms, good self-control (both objective and subjective assessment), few other risk factors, and identifiable protective factors, including available and accessible social support.  Follow-up Information  [facility] [place] .   Why:  7/31 at 8:45am for therapy with Lauren. 8/8 at 8:30am for medication management.  Contact information: [address]  [address] [place] [address] Phone: [phone] Fax: [phone]          Plan Of Care/Follow-up recommendations:  Activity:  as tolerated  Diet:  Regular Tests:  NA Other:  see below  Patient is requesting discharge and there are no current grounds for involuntary commitment  She is leaving unit in good spirits  Plans to follow up as above  ([facility])  Nehemiah Massed, MD 01/30/2016, 11:38 AM

## 2016-02-05 ENCOUNTER — Ambulatory Visit (INDEPENDENT_AMBULATORY_CARE_PROVIDER_SITE_OTHER): Payer: Self-pay | Admitting: Physician Assistant

## 2016-02-05 ENCOUNTER — Telehealth: Payer: Self-pay | Admitting: *Deleted

## 2016-02-05 VITALS — BP 104/60 | HR 79 | Temp 98.6°F | Resp 16 | Ht 69.69 in | Wt 153.0 lb

## 2016-02-05 DIAGNOSIS — F3130 Bipolar disorder, current episode depressed, mild or moderate severity, unspecified: Secondary | ICD-10-CM

## 2016-02-05 DIAGNOSIS — G4700 Insomnia, unspecified: Secondary | ICD-10-CM

## 2016-02-05 MED ORDER — LORAZEPAM 1 MG PO TABS: 1.0000 mg | ORAL_TABLET | Freq: Three times a day (TID) | ORAL | 0 refills | Status: DC | PRN

## 2016-02-05 NOTE — Progress Notes (Signed)
Patient ID: Shelby Bennett, female    DOB: 06-22-1991, 25 y.o.   MRN: [id number]  PCP: Porfirio Oar, PA-C  Subjective:   Chief Complaint  Patient presents with  . Medication Refill    depakote, prozac, and ativan  . Positive PHQ    HPI Presents for medication refills.  It has been years since I last saw Shelby Bennett. In the interim, she's been seen by my colleagues and psychiatry. Behavioral [facility]   Today she presents for prescription refills. She was recently an inpatient at behavioral Health, due to suicidal ideation, loss of control of bipolar disorder. She was experiencing mood swings, depression, mania, panic, insomnia. She believes that the titrating dose of Seroquel XR, prescribed by her outpatient provider was causing the symptoms. When she was unable to function at home due to anxiety, she presented to the ED for evaluation.    Her mood was stabilized with the addition of Depakote, and she was started on fluoxetine (which she had been on previously with success) and lorazepam. Ramelton was prescribed for insomnia, which she reports did not help at all, so she did not fill the outpatient prescription.   She was discharged to home with her husband on 01/30/16. She was scheduled to see a therapist ([facility]) this morning at 8 am, and the nurse for a med check next Monday (02/12/2016). When she arrived this morning, the office was closed. No one came to the door when she knocked. No one answered the phone when she called. She left a message on the clinic number, and on the cell number of the counselor, and has not heard back. She is extremely frustrated, and relates having had a panic attack this morning when she wasn't able to reach anyone regarding her appointment. 0.5 mg of lorazepam resolved her panic symptoms.  Since hospital discharge she has felt stable, without suicidal ideations. She feels that she is not remembering things well, which she thinks is a result of the multiple  medication changes, and is writing herself notes and putting reminders in her phone to make sure she remembers instructions, etc. She believes that the fluoxetine dose needs to be increased. She has scheduled an appointment with Dr. Evelene Croon, but isn't able to be seen until November.    Review of Systems Psychiatric/Behavioral: Positive for confusion (related to medication change, has been writing things down ), dysphoric mood (still having some crying spells) and sleep disturbance (nightmares). Negative for hallucinations, self-injury and suicidal ideas. The patient is nervous/anxious.   All other systems reviewed and are negative.    Patient Active Problem List   Diagnosis Date Noted  . Bipolar disorder current episode depressed (HCC) 01/27/2016  . Bipolar 1 disorder, depressed, severe (HCC) 01/27/2016  . Bipolar affective disorder, current episode manic (HCC) 10/10/2012    Class: Acute  . Anxiety and depression 04/14/2012  . ADHD (attention deficit hyperactivity disorder) 04/14/2012     Prior to Admission medications   Medication Sig Start Date End Date Taking? Authorizing Provider  divalproex (DEPAKOTE ER) 500 MG 24 hr tablet Take 1 tablet (500 mg total) by mouth 2 (two) times daily. For mood stabilization 01/30/16  Yes Sanjuana Kava, NP  FLUoxetine (PROZAC) 10 MG capsule Take 1 capsule (10 mg total) by mouth daily. For depression 01/30/16  Yes Sanjuana Kava, NP  ibuprofen (ADVIL,MOTRIN) 800 MG tablet Take 1 tablet (800 mg total) by mouth 3 (three) times daily. For moderate pain 01/30/16  Yes Sanjuana Kava,  NP  LORazepam (ATIVAN) 1 MG tablet Take 1 tablet (1 mg total) by mouth every 8 (eight) hours as needed for anxiety or sleep (agitation). 01/30/16  Yes Sanjuana Kava, NP  ramelteon (ROZEREM) 8 MG tablet Take 1 tablet (8 mg total) by mouth at bedtime. For sleep 01/30/16  Yes Sanjuana Kava, NP  Calcium Carb-Cholecalciferol (CALCIUM-VITAMIN D) 600-400 MG-UNIT TABS Take 1 tablet by mouth daily.     Historical Provider, MD     Allergies  Allergen Reactions  . 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 Zithromax [Azithromycin Dihydrate] Diarrhea and Nausea And Vomiting  . Latex Swelling  . Debby Freiberg Er]     Suicidal rash  . Lavender Oil Hives       Objective:  Physical Exam  Constitutional: She is oriented to person, place, and time. She appears well-developed and well-nourished. She is active and cooperative. No distress.  BP 104/60 (BP Location: Right Arm, Patient Position: Sitting, Cuff Size: Normal)   Pulse 79   Temp 98.6 F (37 C)   Resp 16   Ht 5' 9.69" (1.77 m)   Wt 153 lb (69.4 kg)   LMP 01/16/2016 (Exact Date)   SpO2 99%   BMI 22.15 kg/m    HENT:  Head: Normocephalic and atraumatic.  Right Ear: Hearing normal.  Left Ear: Hearing normal.  Eyes: Conjunctivae are normal. No scleral icterus.  Neck: Normal range of motion. Neck supple. No thyromegaly present.  Cardiovascular: Normal rate, regular rhythm and normal heart sounds.   Pulses:      Radial pulses are 2+ on the right side, and 2+ on the left side.  Pulmonary/Chest: Effort normal and breath sounds normal.  Lymphadenopathy:       Head (right side): No tonsillar, no preauricular, no posterior auricular and no occipital adenopathy present.       Head (left side): No tonsillar, no preauricular, no posterior auricular and no occipital adenopathy present.    She has no cervical adenopathy.       Right: No supraclavicular adenopathy present.       Left: No supraclavicular adenopathy present.  Neurological: She is alert and oriented to person, place, and time. No sensory deficit.  Skin: Skin is warm, dry and intact. No rash noted. No cyanosis or erythema. Nails show no clubbing.  Psychiatric: She has a normal mood and affect. Her speech is normal and behavior is normal. Judgment and thought content normal. Cognition and memory are normal.           Assessment & Plan:    1. Bipolar affective disorder, current episode depressed, current episode severity unspecified (HCC) 2. Insomnia Increase fluoxetine from 10 mg to 20 mg, by taking 2 of the 10 mg tablets together once each day. Continue lorazepam PRN agitation and/or insomnia. Encouraged her to give [facility] [place] another chance to see her for therapy and medication management, at least until she can see Dr. Evelene Croon in November. If she isn't able to sort things out there, she will contact several other counseling centers in [place] to schedule and I will provide her prescriptions until she can see Dr. Evelene Croon, with the next visit planned for 02/19/2016. - LORazepam (ATIVAN) 1 MG tablet; Take 1 tablet (1 mg total) by mouth every 8 (eight) hours as needed for anxiety or sleep (agitation).  Dispense: 10 tablet; Refill: 0   Fernande Bras, PA-C Physician Assistant-Certified [facility]

## 2016-02-05 NOTE — Patient Instructions (Addendum)
If you do not hear from [facility] [place] by midday on Wednesday, let me know. At that point, go ahead and call the offices on the list below to schedule for therapy. I will make sure that you have enough medications to last until you can come back in to see me on Monday 02/19/2016.    IF you received an x-ray today, you will receive an invoice from [facility]. Please contact [facility] at [phone] with questions or concerns regarding your invoice.   IF you received labwork today, you will receive an invoice from [employer]. Please contact [facility] at [phone] with questions or concerns regarding your invoice.   Our billing staff will not be able to assist you with questions regarding bills from these companies.  You will be contacted with the lab results as soon as they are available. The fastest way to get your results is to activate your My Chart account. Instructions are located on the last page of this paperwork. If you have not heard from [place] regarding the results in 2 weeks, please contact this office.    [facility] [address] [place], [address] [place], [place] [address] ???PHONE: [phone] FAX: [phone] EMAIL: [email or web address]  [facility]  Address: [address] [place], [place] [address]  Phone: [phone]  [facility] [address]  [address] [place], [place] [address] [phone]   [facility] [address] [place], [place] [address] [phone]

## 2016-02-05 NOTE — Telephone Encounter (Signed)
Lmom to increase Prozac to 2 tabs per day.

## 2016-02-05 NOTE — Progress Notes (Signed)
Subjective:    Patient ID: Shelby Bennett, female    DOB: 06-16-1991, 25 y.o.   MRN: [id number]  Medication Refill   [name] was recently released from [facility] on this past Wednesday 7/26. She was admitted there because she was placed on seroquel to try to treat her bipolar disorder and after taking this for a few weeks had a panic attack lasting 3 hours and suicidal ideations. While at [facility] she was placed on depakote, prozac, attivan, and rozerem. She was supposed to follow up with [facility] for counseling this AM, but when she arrived no one was there. She tried calling and could not get anyone on the phone. She is frustrated by this and had an anxiety attack about the missed appointment this morning. She took 0.5 mg attivan and panic symptoms resolved. Since being released she feels like her mood is more stable, anxiety attacks are improving, and she has no hallucinations or suicidal ideation. She does feel like she has some confusion but attributes this to medication changes, and is able to write reminders for herself.  She is scheduled for medication management on 02/13/2016 at [facility] in [place], however with this AM's events she is nervous about continuing tx there. She was released from the hospital with a month's supply of medication. She has made an appointment with Dr. Evelene Croon for future medication management, but that office has no openings until November. She is currently taking depakote 500 mg PO BID. She is also taking 10 mg PO prozac daily, but would like to increase this dose given that she is still having crying spells. She is taking attivan when needed and takes 1 mg PO to sleep, 0.5 mg PO for acute panic symptoms. She has been taking rozerem 8 mg PO for sleep, but feels like it does not help her at all.   Review of Systems  Psychiatric/Behavioral: Positive for confusion (related to medication change, has been writing things down ), dysphoric mood (still having some  crying spells) and sleep disturbance (nightmares). Negative for hallucinations, self-injury and suicidal ideas. The patient is nervous/anxious.   All other systems reviewed and are negative.   Current Outpatient Prescriptions:  .  divalproex (DEPAKOTE ER) 500 MG 24 hr tablet, Take 1 tablet (500 mg total) by mouth 2 (two) times daily. For mood stabilization, Disp: 60 tablet, Rfl: 0 .  FLUoxetine (PROZAC) 10 MG capsule, Take 1 capsule (10 mg total) by mouth daily. For depression, Disp: 30 capsule, Rfl: 0 .  ibuprofen (ADVIL,MOTRIN) 800 MG tablet, Take 1 tablet (800 mg total) by mouth 3 (three) times daily. For moderate pain, Disp: 18 tablet, Rfl: 0 .  LORazepam (ATIVAN) 1 MG tablet, Take 1 tablet (1 mg total) by mouth every 8 (eight) hours as needed for anxiety or sleep (agitation)., Disp: 7 tablet, Rfl: 0 .  ramelteon (ROZEREM) 8 MG tablet, Take 1 tablet (8 mg total) by mouth at bedtime. For sleep, Disp: 20 tablet, Rfl: 0 .  Calcium Carb-Cholecalciferol (CALCIUM-VITAMIN D) 600-400 MG-UNIT TABS, Take 1 tablet by mouth daily., Disp: , Rfl:    Allergies  Allergen Reactions  . 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 Zithromax [Azithromycin Dihydrate] Diarrhea and Nausea And Vomiting  . Latex Swelling  . Debby Freiberg Er]     Suicidal rash  . Lavender Oil Hives      Objective:   Physical Exam  Constitutional: She is oriented to person, place, and  time. She appears well-developed and well-nourished. She is cooperative. No distress.  Blood pressure 104/60, pulse 79, temperature 98.6 F (37 C), resp. rate 16, height 5' 9.69" (1.77 m), weight 153 lb (69.4 kg), last menstrual period 01/16/2016, SpO2 99 %.  HENT:  Head: Normocephalic and atraumatic.  Eyes: Conjunctivae and lids are normal.  Neck: Trachea normal and normal range of motion. Neck supple. No thyromegaly present.  Cardiovascular: Normal rate, regular rhythm, normal heart sounds and intact distal  pulses.   Pulmonary/Chest: Effort normal and breath sounds normal.  Abdominal: Soft. There is no tenderness.  Musculoskeletal: Normal range of motion.  Lymphadenopathy:    She has no cervical adenopathy.  Neurological: She is alert and oriented to person, place, and time. She has normal strength. No cranial nerve deficit or sensory deficit.  Skin: Skin is warm, dry and intact.  Psychiatric: Her speech is normal and behavior is normal. Judgment and thought content normal. Her mood appears anxious. Her affect is not inappropriate. Thought content is not delusional. Cognition and memory are normal. She expresses no suicidal ideation. She expresses no suicidal plans.      Assessment & Plan:  1. Bipolar affective disorder, current episode depressed, current episode severity unspecified (HCC) - increased fluoxetine from 10 mg PO daily to 20 mg PO daily.  - Encouraged to try and contact [facility] [place] again for counseling and medication management until she can see Dr. Evelene Croon - If unable to work things out with [facility], will contact other counseling centers and will follow up here for medication management - next planned visit here 02/19/2016  2. Insomnia - LORazepam (ATIVAN) 1 MG tablet; Take 1 tablet (1 mg total) by mouth every 8 (eight) hours as needed for anxiety or sleep (agitation).  Dispense: 10 tablet; Refill: 0  Kelly Rayburn PA-S 02/05/16

## 2016-02-08 ENCOUNTER — Encounter: Payer: Self-pay | Admitting: Physician Assistant

## 2016-03-15 ENCOUNTER — Ambulatory Visit (INDEPENDENT_AMBULATORY_CARE_PROVIDER_SITE_OTHER): Payer: Self-pay | Admitting: Urgent Care

## 2016-03-15 VITALS — BP 118/58 | HR 86 | Temp 99.2°F | Resp 16 | Ht 69.0 in | Wt 161.2 lb

## 2016-03-15 DIAGNOSIS — F3130 Bipolar disorder, current episode depressed, mild or moderate severity, unspecified: Secondary | ICD-10-CM

## 2016-03-15 DIAGNOSIS — Z79899 Other long term (current) drug therapy: Secondary | ICD-10-CM

## 2016-03-15 LAB — CBC
HEMATOCRIT: 42.2 % (ref 35.0–45.0)
HEMOGLOBIN: 14.1 g/dL (ref 11.7–15.5)
MCH: 31.5 pg (ref 27.0–33.0)
MCHC: 33.4 g/dL (ref 32.0–36.0)
MCV: 94.4 fL (ref 80.0–100.0)
MPV: 11.2 fL (ref 7.5–12.5)
Platelets: 173 10*3/uL (ref 140–400)
RBC: 4.47 MIL/uL (ref 3.80–5.10)
RDW: 13.8 % (ref 11.0–15.0)
WBC: 5.1 10*3/uL (ref 3.8–10.8)

## 2016-03-15 NOTE — Patient Instructions (Addendum)
Lamotrigine tablets  What is this medicine?  LAMOTRIGINE (la MOE tri jeen) is used to control seizures in adults and children with epilepsy and Lennox-Gastaut syndrome. It is also used in adults to treat bipolar disorder.  This medicine may be used for other purposes; ask your health care provider or pharmacist if you have questions.  What should I tell my health care provider before I take this medicine?  They need to know if you have any of these conditions:  -a history of depression or bipolar disorder  -aseptic meningitis during prior use of lamotrigine  -folate deficiency  -kidney disease  -liver disease  -suicidal thoughts, plans, or attempt; a previous suicide attempt by you or a family member  -an unusual or allergic reaction to lamotrigine or other seizure medications, other medicines, foods, dyes, or preservatives  -pregnant or trying to get pregnant  -breast-feeding  How should I use this medicine?  Take this medicine by mouth with a glass of water. Follow the directions on the prescription label. Do not chew these tablets. If this medicine upsets your stomach, take it with food or milk. Take your doses at regular intervals. Do not take your medicine more often than directed.  A special MedGuide will be given to you by the pharmacist with each new prescription and refill. Be sure to read this information carefully each time.  Talk to your pediatrician regarding the use of this medicine in children. While this drug may be prescribed for children as young as 2 years for selected conditions, precautions do apply.  Overdosage: If you think you have taken too much of this medicine contact a poison control center or emergency room at once.  NOTE: This medicine is only for you. Do not share this medicine with others.  What if I miss a dose?  If you miss a dose, take it as soon as you can. If it is almost time for your next dose, take only that dose. Do not take double or extra doses.  What may interact with this  medicine?  -carbamazepine  -female hormones, including contraceptive or birth control pills  -methotrexate  -phenobarbital  -phenytoin  -primidone  -pyrimethamine  -rifampin  -trimethoprim  -valproic acid  This list may not describe all possible interactions. Give your health care provider a list of all the medicines, herbs, non-prescription drugs, or dietary supplements you use. Also tell them if you smoke, drink alcohol, or use illegal drugs. Some items may interact with your medicine.  What should I watch for while using this medicine?  Visit your doctor or health care professional for regular checks on your progress. If you take this medicine for seizures, wear a Medic Alert bracelet or necklace. Carry an identification card with information about your condition, medicines, and doctor or health care professional.  It is important to take this medicine exactly as directed. When first starting treatment, your dose will need to be adjusted slowly. It may take weeks or months before your dose is stable. You should contact your doctor or health care professional if your seizures get worse or if you have any new types of seizures. Do not stop taking this medicine unless instructed by your doctor or health care professional. Stopping your medicine suddenly can increase your seizures or their severity.  Contact your doctor or health care professional right away if you develop a rash while taking this medicine. Rashes may be very severe and sometimes require treatment in the hospital. Deaths from rashes have   You may get drowsy, dizzy, or have blurred vision. Do not drive, use machinery, or do anything that needs mental alertness until you know how this medicine affects you. To reduce dizzy or fainting spells,  do not sit or stand up quickly, especially if you are an older patient. Alcohol can increase drowsiness and dizziness. Avoid alcoholic drinks. If you are taking this medicine for bipolar disorder, it is important to report any changes in your mood to your doctor or health care professional. If your condition gets worse, you get mentally depressed, feel very hyperactive or manic, have difficulty sleeping, or have thoughts of hurting yourself or committing suicide, you need to get help from your health care professional right away. If you are a caregiver for someone taking this medicine for bipolar disorder, you should also report these behavioral changes right away. The use of this medicine may increase the chance of suicidal thoughts or actions. Pay special attention to how you are responding while on this medicine. Your mouth may get dry. Chewing sugarless gum or sucking hard candy, and drinking plenty of water may help. Contact your doctor if the problem does not go away or is severe. Women who become pregnant while using this medicine may enroll in the [place] Antiepileptic Drug Pregnancy Registry by calling [phone]. This registry collects information about the safety of antiepileptic drug use during pregnancy. What side effects may I notice from receiving this medicine? Side effects you should report to your doctor or health care professional as soon as possible: -allergic reactions like skin rash, itching or hives, swelling of the face, lips, or tongue -blurred or double vision -difficulty walking or controlling muscle movements -fever -headache, stiff neck, and sensitivity to light -painful sores in the mouth, eyes, or nose -redness, blistering, peeling or loosening of the skin, including inside the mouth -severe muscle pain -swollen lymph glands -uncontrollable eye movements -unusual bruising or bleeding -unusually weak or tired -vomiting -worsening of mood, thoughts or  actions of suicide or dying -yellowing of the eyes or skin Side effects that usually do not require medical attention (report to your doctor or health care professional if they continue or are bothersome): -diarrhea or constipation -difficulty sleeping -nausea -tremors This list may not describe all possible side effects. Call your doctor for medical advice about side effects. You may report side effects to FDA at 1-800-FDA-1088. Where should I keep my medicine? Keep out of reach of children. Store at room temperature between 15 and 30 degrees C (59 and 86 degrees F). Throw away any unused medicine after the expiration date. NOTE: This sheet is a summary. It may not cover all possible information. If you have questions about this medicine, talk to your doctor, pharmacist, or health care provider.    2016, Elsevier/Gold Standard. (2010-06-27 12:21:39)     IF you received an x-ray today, you will receive an invoice from [facility]. Please contact [facility] at [phone] with questions or concerns regarding your invoice.   IF you received labwork today, you will receive an invoice from [facility]. Please contact [facility] at [phone] with questions or concerns regarding your invoice.   Our billing staff will not be able to assist you with questions regarding bills from these companies.  You will be contacted with the lab results as soon as they are available. The fastest way to get your results is to activate your My Chart account. Instructions are located on the last page of this paperwork. If  you have not heard from [place] regarding the results in 2 weeks, please contact this office.

## 2016-03-15 NOTE — Progress Notes (Signed)
    MRN: [id number] DOB: [birth year]  Subjective:   Shelby Bennett is a 25 y.o. female presenting for chief complaint of Other (Labwork, CBC w/diff, Liver function test, Depakote level,  (pt declined flu shot))  Patient has history of bipolar disorder, currently seeing Jodi Mourningeborah Bullard. She is taking Depakote, Prozac. Reports that she has had a difficult time obtaining an orgasm since increasing her dose of Prozac from 10mg  to 20mg . Otherwise, she admits that her mood has been pretty even keel. She would like to consider other options instead of Prozac for her management of bipolar disorder. She plans on working with a different therapist, psychiatrist in the near future. Denies SI, HI. Denies euphoria, irritability, rapid and pressured speech, grandiose thoughts.   Shelby Bennett has a current medication list which includes the following prescription(s): divalproex, fluoxetine, ibuprofen, lorazepam, calcium-vitamin d, and ramelteon. Also is allergic to diclofenac sodium; zithromax [azithromycin dihydrate]; latex; equetro [carbamazepine er]; and lavender oil.  Shelby Bennett  has a past medical history of ADHD (attention deficit hyperactivity disorder); Allergy; Anxiety; Arthritis; Asthma; Bipolar disorder (HCC); Celiac disease; Depression; Herpes; Mononucleosis (2012); Ovarian cyst; PTSD (post-traumatic stress disorder); and Stroke (HCC). Also  has a past surgical history that includes Appendectomy.  Objective:   Vitals: BP (!) 118/58 (BP Location: Right Arm, Patient Position: Sitting, Cuff Size: Normal)   Pulse 86   Temp 99.2 F (37.3 C) (Oral)   Resp 16   Ht 5\' 9"  (1.753 m)   Wt 161 lb 3.2 oz (73.1 kg)   LMP 03/12/2016   SpO2 98%   BMI 23.81 kg/m   Physical Exam  Constitutional: She is oriented to person, place, and time. She appears well-developed and well-nourished.  Cardiovascular: Normal rate.   Pulmonary/Chest: Effort normal.  Neurological: She is alert and oriented to person,  place, and time.  Psychiatric: Her mood appears not anxious. Her affect is not labile. Her speech is not rapid and/or pressured and not tangential. She is not agitated and not aggressive. She does not exhibit a depressed mood. She expresses no homicidal and no suicidal ideation.   Assessment and Plan :   1. Bipolar affective disorder, current episode depressed, current episode severity unspecified (HCC) 2. Other long term (current) drug therapy - I discussed management of bipolar disorder. I suggested patient discussed the possibility of using Lamictal with Depakote for the management of her bipolar disorder. Labs are pending, I will fax these over to her provider when they are available.   Wallis BambergMario Josephine Wooldridge, PA-C [facility] [phone] 03/15/2016 11:45 AM

## 2016-03-16 LAB — HEPATIC FUNCTION PANEL
ALBUMIN: 4.2 g/dL (ref 3.6–5.1)
ALK PHOS: 31 U/L — AB (ref 33–115)
ALT: 10 U/L (ref 6–29)
AST: 15 U/L (ref 10–30)
Bilirubin, Direct: 0.1 mg/dL (ref <=0.2)
Indirect Bilirubin: 0.3 mg/dL (ref 0.2–1.2)
TOTAL PROTEIN: 6.8 g/dL (ref 6.1–8.1)
Total Bilirubin: 0.4 mg/dL (ref 0.2–1.2)

## 2016-03-16 LAB — VALPROIC ACID LEVEL: Valproic Acid Lvl: 84.2 ug/mL (ref 50.0–100.0)

## 2016-11-12 ENCOUNTER — Ambulatory Visit (INDEPENDENT_AMBULATORY_CARE_PROVIDER_SITE_OTHER): Payer: Self-pay | Admitting: Physician Assistant

## 2016-11-12 ENCOUNTER — Encounter: Payer: Self-pay | Admitting: Physician Assistant

## 2016-11-12 VITALS — BP 107/68 | HR 65 | Temp 98.1°F | Resp 20 | Ht 69.69 in | Wt 162.2 lb

## 2016-11-12 DIAGNOSIS — J029 Acute pharyngitis, unspecified: Secondary | ICD-10-CM

## 2016-11-12 DIAGNOSIS — F314 Bipolar disorder, current episode depressed, severe, without psychotic features: Secondary | ICD-10-CM

## 2016-11-12 LAB — POCT CBC
Granulocyte percent: 33.3 %G — AB (ref 37–80)
HEMATOCRIT: 40.5 % (ref 37.7–47.9)
Hemoglobin: 14 g/dL (ref 12.2–16.2)
LYMPH, POC: 1.7 (ref 0.6–3.4)
MCH, POC: 32.4 pg — AB (ref 27–31.2)
MCHC: 34.6 g/dL (ref 31.8–35.4)
MCV: 93.7 fL (ref 80–97)
MID (cbc): 0.4 (ref 0–0.9)
MPV: 8.7 fL (ref 0–99.8)
POC GRANULOCYTE: 1 — AB (ref 2–6.9)
POC LYMPH %: 53.9 % — AB (ref 10–50)
POC MID %: 12.8 %M — AB (ref 0–12)
Platelet Count, POC: 147 10*3/uL (ref 142–424)
RBC: 4.32 M/uL (ref 4.04–5.48)
RDW, POC: 12.6 %
WBC: 3.1 10*3/uL — AB (ref 4.6–10.2)

## 2016-11-12 LAB — POCT RAPID STREP A (OFFICE): RAPID STREP A SCREEN: NEGATIVE

## 2016-11-12 MED ORDER — AZELASTINE HCL 0.15 % NA SOLN
2.0000 | Freq: Two times a day (BID) | NASAL | 0 refills | Status: AC
Start: 2016-11-12 — End: ?

## 2016-11-12 NOTE — Patient Instructions (Addendum)
The CBC reflects a likely viral illness, which fits with the respiratory symptoms. The nasal spray should help with that. So will rest and lots of liquids to drink. If the throat culture shows Strep, I'll send in an antibiotic.  Talk with Ms. Shelby Bennett regarding alternatives to Lamictal.     IF you received an x-ray today, you will receive an invoice from [facility]. Please contact [facility] at [phone] with questions or concerns regarding your invoice.   IF you received labwork today, you will receive an invoice from [facility]. Please contact [facility] at [phone] with questions or concerns regarding your invoice.   Our billing staff will not be able to assist you with questions regarding bills from these companies.  You will be contacted with the lab results as soon as they are available. The fastest way to get your results is to activate your My Chart account. Instructions are located on the last page of this paperwork. If you have not heard from us regarding the results in 2 weeks, please contact this office.

## 2016-11-12 NOTE — Assessment & Plan Note (Signed)
Stop lamotrigine, as she may have sore throat, dry eyes due to this drug, though more likely due to viral illness. Given the increased moodiness, nightmares and hotflashes, she will discuss alternative treatments.

## 2016-11-12 NOTE — Progress Notes (Signed)
Patient ID: Shelby Bennett, female    DOB: 1990-09-29, 26 y.o.   MRN: [id number]  PCP: Porfirio Oar, PA-C  Chief Complaint  Patient presents with  . Sore Throat    back of throat really sore x1week  . Lymphadenopathy    Subjective:   Presents for evaluation of possible SJS.  Started taking Lamictal about 3 weeks ago, and then increased the dose 11/03/2016. The next day she developed redness, swelling and pain on the RIGHT side of the throat. About 3 days later, she had similar sensations on the LEFT. Also experiencing hot flashes, nightmares and mood swings. Some post nasal drainage. Has had chills. Doesn't have a way to measure temperature. No nausea, vomiting or diarrhea. Eyes feel dry, as though there is something in them. "Just doesn't feel right." Some ringing in the RIGHT ear. Thinks she has increased wax production. She took Benadryl without any change in symptoms.  Skipped the dose 5/06 due to consuming some EtOH.  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  Last night took 1/2 dose. She has noticed significant improvement already. The tender node on the RIGHT has resolved and the one on the LEFT is much smaller, but still tender. She has been performing lymph massage in an effort to reduce the swelling and pain.  Sister Marisue Ivan is undergoing evaluation for involuntary tics. 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 Marisue Ivan believes that she may have Lyme's Disease, but her clinician refuses to order the testing.    Review of Systems As above.    Patient Active Problem List   Diagnosis Date Noted  . Bipolar disorder current episode depressed (HCC) 01/27/2016  . Bipolar 1 disorder, depressed, severe (HCC) 01/27/2016  . Bipolar affective disorder, current episode  manic (HCC) 10/10/2012    Class: Acute  . Anxiety and depression 04/14/2012  . ADHD (attention deficit hyperactivity disorder) 04/14/2012     Prior to Admission medications   Medication Sig Start Date End Date Taking? Authorizing Provider  DULoxetine (CYMBALTA) 60 MG capsule Take 20 mg by mouth daily.   Yes [provider]  ibuprofen (ADVIL,MOTRIN) 800 MG tablet Take 1 tablet (800 mg total) by mouth 3 (three) times daily. For moderate pain 01/30/16  Yes Armandina Stammer I, NP  lamoTRIgine (LAMICTAL) 25 MG tablet Take 25 mg by mouth 2 (two) times daily. Take two tabs   Yes [provider]     Allergies  Allergen Reactions  . 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 Zithromax [Azithromycin Dihydrate] Diarrhea and Nausea And Vomiting  . Latex Swelling  . Debby Freiberg Er]     Suicidal rash  . Lavender Oil Hives  . Lithium Other (See Comments)    Intolerance, change in mental status.       Objective:  Physical Exam  Constitutional: She is oriented to person, place, and time. She appears well-developed and well-nourished. She is active and cooperative. No distress.  BP 107/68   Pulse 65   Temp 98.1 F (36.7 C) (Oral)   Resp 20   Ht 5' 9.69" (1.77 m)   Wt 162 lb 3.2 oz (73.6 kg)   LMP 10/29/2016   SpO2 99%   BMI 23.48 kg/m   HENT:  Head: Normocephalic and atraumatic.  Right Ear: Hearing normal.  Left Ear: Hearing normal.  Nose: Mucosal edema (mild) present. No rhinorrhea.  Mouth/Throat: Uvula is midline, oropharynx is clear and moist and mucous membranes are normal. No oral lesions. Normal dentition.  Eyes: Conjunctivae are normal. No scleral icterus.  Neck: Normal range of motion. Neck supple. No thyromegaly present.  Cardiovascular: Normal rate, regular rhythm and normal heart sounds.   Pulses:      Radial pulses are 2+ on the right side, and 2+ on the left side.  Pulmonary/Chest: Effort normal and breath sounds  normal.  Lymphadenopathy:       Head (right side): No tonsillar, no preauricular, no posterior auricular and no occipital adenopathy present.       Head (left side): Tonsillar adenopathy present. No preauricular, no posterior auricular and no occipital adenopathy present.    She has no cervical adenopathy.       Right: No supraclavicular adenopathy present.       Left: No supraclavicular adenopathy present.  Neurological: She is alert and oriented to person, place, and time. No sensory deficit.  Skin: Skin is warm, dry and intact. No rash noted. No cyanosis or erythema. Nails show no clubbing.  Psychiatric: She has a normal mood and affect. Her speech is normal and behavior is normal.       Results for orders placed or performed in visit on 11/12/16  POCT CBC  Result Value Ref Range   WBC 3.1 (A) 4.6 - 10.2 K/uL   Lymph, poc 1.7 0.6 - 3.4   POC LYMPH PERCENT 53.9 (A) 10 - 50 %L   MID (cbc) 0.4 0 - 0.9   POC MID % 12.8 (A) 0 - 12 %M   POC Granulocyte 1.0 (A) 2 - 6.9   Granulocyte percent 33.3 (A) 37 - 80 %G   RBC 4.32 4.04 - 5.48 M/uL   Hemoglobin 14.0 12.2 - 16.2 g/dL   HCT, POC 16.140.5 09.637.7 - 47.9 %   MCV 93.7 80 - 97 fL   MCH, POC 32.4 (A) 27 - 31.2 pg   MCHC 34.6 31.8 - 35.4 g/dL   RDW, POC 04.512.6 %   Platelet Count, POC 147 142 - 424 K/uL   MPV 8.7 0 - 99.8 fL  POCT rapid strep A  Result Value Ref Range   Rapid Strep A Screen Negative Negative       Assessment & Plan:   Problem List Items Addressed This Visit    Bipolar 1 disorder, depressed, severe (HCC)    Stop lamotrigine, as she may have sore throat, dry eyes due to this drug, though more likely due to viral illness. Given the increased moodiness, nightmares and hotflashes, she will discuss alternative treatments.      Relevant Medications   DULoxetine (CYMBALTA) 60 MG capsule    Other Visit Diagnoses    Sore throat    -  Primary   CBC suggests viral illness. Supportive care. Anticipatory guidance provided.    Relevant Medications   Azelastine HCl 0.15 % SOLN   Other Relevant Orders   POCT CBC (Completed)   POCT rapid strep A (Completed)   Culture, Group A Strep       Return in about 4 weeks (around 12/10/2016) for repeat CBC and Wellness visit.   Fernande Brashelle S. Zaden Sako, PA-C Primary Care at [facility]

## 2016-11-14 LAB — CULTURE, GROUP A STREP: Strep A Culture: NEGATIVE

## 2016-12-04 ENCOUNTER — Encounter: Payer: Self-pay | Admitting: Physician Assistant

## 2016-12-10 ENCOUNTER — Encounter: Payer: Self-pay | Admitting: Physician Assistant

## 2016-12-10 ENCOUNTER — Ambulatory Visit (INDEPENDENT_AMBULATORY_CARE_PROVIDER_SITE_OTHER): Payer: Self-pay | Admitting: Physician Assistant

## 2016-12-10 VITALS — BP 122/79 | HR 76 | Temp 97.3°F | Resp 16 | Ht 69.75 in | Wt 164.6 lb

## 2016-12-10 DIAGNOSIS — F319 Bipolar disorder, unspecified: Secondary | ICD-10-CM

## 2016-12-10 DIAGNOSIS — Z1329 Encounter for screening for other suspected endocrine disorder: Secondary | ICD-10-CM

## 2016-12-10 DIAGNOSIS — Z0000 Encounter for general adult medical examination without abnormal findings: Secondary | ICD-10-CM

## 2016-12-10 DIAGNOSIS — Z23 Encounter for immunization: Secondary | ICD-10-CM

## 2016-12-10 DIAGNOSIS — Z13228 Encounter for screening for other metabolic disorders: Secondary | ICD-10-CM

## 2016-12-10 DIAGNOSIS — D72819 Decreased white blood cell count, unspecified: Secondary | ICD-10-CM

## 2016-12-10 DIAGNOSIS — Z124 Encounter for screening for malignant neoplasm of cervix: Secondary | ICD-10-CM

## 2016-12-10 DIAGNOSIS — Z13220 Encounter for screening for lipoid disorders: Secondary | ICD-10-CM

## 2016-12-10 NOTE — Progress Notes (Signed)
Patient ID: Shelby Bennett, female    DOB: [birth year], 26 y.o.   MRN: [id number]  PCP: Porfirio Oar, PA-C  Chief Complaint  Patient presents with  . Annual Exam    with pap    Subjective:   Presents for [employer] Visit.  Cervical Cancer Screening: ASCUS with +HPV in 2014. Did not return for follow-up. Breast Cancer Screening: monthly SBE, infrequent CBE. Not yet a candidate for mammography. Colorectal Cancer Screening: not yet a candidate Bone Density Testing: not yet a candidate HIV Screening: 2014, NEGATIVE STI Screening: history of HSV, HPV on pap. Monogamous x 4 years. Husband is female-to-female transgender. Td/Tdap Vaccination: 2006 Pneumococcal Vaccination: not yet a candidate Zoster Vaccination: not yet a candidate Frequency of Dental evaluation: overdue Frequency of Eye evaluation: overdue  Since her last visit with me, her medications have been changed. Now on Abilify, which is working well and she has more energy. "Feel like a normal person for the first time in a while." Rarely needing clonazepam, using CBD to manage anxiety symptoms.  Husband (female-to-female transgender) is scheduled to have a hysterectomy. Her sister is dealing with some stressful issues. For the first time, her family is looking to her for support, instead of the other way around. This feels really good to her.  Has muscle and joint pain (shoulders, neck, lumbago and knees). Q2 weeks massage helps. Also uses ibuprofen and CBD.    Patient Active Problem List   Diagnosis Date Noted  . Bipolar disorder current episode depressed (HCC) 01/27/2016  . Bipolar 1 disorder, depressed, severe (HCC) 01/27/2016  . Bipolar affective disorder, current episode manic (HCC) 10/10/2012    Class: Acute  . Anxiety and depression 04/14/2012  . ADHD (attention deficit hyperactivity disorder) 04/14/2012    Past Medical History:  Diagnosis Date  . ADHD (attention deficit hyperactivity disorder)     . Allergy   . Anxiety   . Arthritis   . Asthma   . Bipolar disorder (HCC)   . Celiac disease    ? self diagnosis  . Depression   . Herpes   . Mononucleosis 2012  . Ovarian cyst    left  . PTSD (post-traumatic stress disorder)   . Stroke (HCC)    L facial sxs x 2 d     Prior to Admission medications   Medication Sig Start Date End Date Taking? Authorizing Provider  ARIPiprazole (ABILIFY) 2 MG tablet Take 0.25 mg by mouth daily.   Yes [provider]  clonazePAM (KLONOPIN) 1 MG tablet Take 1 mg by mouth 2 (two) times daily.   Yes [provider]  DULoxetine (CYMBALTA) 60 MG capsule Take 20 mg by mouth daily.   Yes [provider]  ibuprofen (ADVIL,MOTRIN) 800 MG tablet Take 1 tablet (800 mg total) by mouth 3 (three) times daily. For moderate pain 01/30/16  Yes Nwoko, Nicole Kindred I, NP  Azelastine HCl 0.15 % SOLN Place 2 sprays into both nostrils 2 (two) times daily. Patient not taking: Reported on 12/10/2016 11/12/16   Porfirio Oar, PA-C    Allergies  Allergen Reactions  . 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 Zithromax [Azithromycin Dihydrate] Diarrhea and Nausea And Vomiting  . Latex Swelling  . Debby Freiberg Er]     Suicidal rash  . Lavender Oil Hives  . Lithium Other (See Comments)    Intolerance, change in mental status.  Ardeth Perfect [Lamotrigine] Rash    Past Surgical  History:  Procedure Laterality Date  . APPENDECTOMY      Family History  Problem Relation Age of Onset  . Mental illness Sister        PMDD  . Cervical cancer Sister        s/p ablation  . Mental illness Brother        depression  . Mental illness Brother        depression  . Mental illness Mother        bipolar  . Migraines Father   . Colon cancer Maternal Grandfather   . Esophageal cancer Neg Hx   . Rectal cancer Neg Hx   . Stomach cancer Neg Hx     Social History   Social History  . Marital status: Married    Spouse name:  Coen B. Fojtik  . Number of children: 0  . Years of education: 12+   Occupational History  . massage therapist     [employer]   Social History Main Topics  . Smoking status: Current Every Day Smoker    Packs/day: 0.70    Years: 7.00    Types: Cigarettes    Last attempt to quit: 08/29/2011  . Smokeless tobacco: Never Used  . Alcohol use No  . Drug use: No  . Sexual activity: Yes    Birth control/ protection: None   Other Topics Concern  . None   Social History Narrative   Massage therapist   Lives with her husband, who is female-to-female transgender.    Review of Systems Constitutional: Negative for fatigue and unexpected weight change.  HENT: Positive for congestion, postnasal drip and sinus pressure. Negative for dental problem, drooling, ear discharge, ear pain, facial swelling, hearing loss, mouth sores, nosebleeds, rhinorrhea, sinus pain, sneezing, sore throat, tinnitus, trouble swallowing and voice change.   Eyes: Negative.   Cardiovascular: Negative.   Gastrointestinal: Negative.   Endocrine: Negative.   Genitourinary: Negative.   Musculoskeletal: Positive for arthralgias, back pain and neck pain.  Skin: Negative.   Allergic/Immunologic: Positive for environmental allergies. Negative for food allergies and immunocompromised state.  Neurological: Positive for headaches (associates with nasal congestion/sinus pressure). Negative for dizziness, tremors, seizures, syncope, facial asymmetry, speech difficulty, weakness, light-headedness and numbness.  Hematological: Negative.   Psychiatric/Behavioral: Negative.       Objective:  Physical Exam  Constitutional: She is oriented to person, place, and time. Vital signs are normal. She appears well-developed and well-nourished. She is active and cooperative. No distress.  BP 122/79 (BP Location: Right Arm, Patient Position: Sitting, Cuff Size: Normal)   Pulse 76   Temp 97.3 F (36.3 C) (Oral)   Resp 16   Ht 5' 9.75"  (1.772 m)   Wt 164 lb 9.6 oz (74.7 kg)   LMP 12/01/2016   SpO2 98%   BMI 23.79 kg/m    HENT:  Head: Normocephalic and atraumatic.  Right Ear: Hearing, tympanic membrane, external ear and ear canal normal. No foreign bodies.  Left Ear: Hearing, tympanic membrane, external ear and ear canal normal. No foreign bodies.  Nose: Nose normal.  Mouth/Throat: Uvula is midline, oropharynx is clear and moist and mucous membranes are normal. No oral lesions. Normal dentition. No dental abscesses or uvula swelling. No oropharyngeal exudate.  Eyes: Conjunctivae, EOM and lids are normal. Pupils are equal, round, and reactive to light. Right eye exhibits no discharge. Left eye exhibits no discharge. No scleral icterus.  Fundoscopic exam:      The right eye shows  no arteriolar narrowing, no AV nicking, no exudate, no hemorrhage and no papilledema.       The left eye shows no arteriolar narrowing, no AV nicking, no exudate, no hemorrhage and no papilledema.  Neck: Trachea normal, normal range of motion and full passive range of motion without pain. Neck supple. No spinous process tenderness and no muscular tenderness present. No thyroid mass and no thyromegaly present.  Cardiovascular: Normal rate, regular rhythm, normal heart sounds, intact distal pulses and normal pulses.   Pulmonary/Chest: Effort normal and breath sounds normal. She exhibits no tenderness and no retraction. Right breast exhibits no inverted nipple, no mass, no nipple discharge, no skin change and no tenderness. Left breast exhibits no inverted nipple, no mass, no nipple discharge, no skin change and no tenderness. Breasts are symmetrical.  Abdominal: Soft. Normal appearance and bowel sounds are normal. She exhibits no distension and no mass. There is no hepatosplenomegaly. There is no tenderness. There is no rigidity, no rebound, no guarding, no CVA tenderness, no tenderness at McBurney's point and negative Murphy's sign. No hernia. Hernia  confirmed negative in the right inguinal area and confirmed negative in the left inguinal area.  Genitourinary: Rectum normal, vagina normal and uterus normal. Rectal exam shows no external hemorrhoid and no fissure. No breast swelling, tenderness, discharge or bleeding. Pelvic exam was performed with patient supine. No labial fusion. There is no rash, tenderness, lesion or injury on the right labia. There is no rash, tenderness, lesion or injury on the left labia. Cervix exhibits no motion tenderness, no discharge and no friability. Right adnexum displays no mass, no tenderness and no fullness. Left adnexum displays no mass, no tenderness and no fullness. No erythema, tenderness or bleeding in the vagina. No foreign body in the vagina. No signs of injury around the vagina. No vaginal discharge found.  Musculoskeletal: She exhibits no edema or tenderness.       Cervical back: Normal.       Thoracic back: Normal.       Lumbar back: Normal.  Lymphadenopathy:       Head (right side): No tonsillar, no preauricular, no posterior auricular and no occipital adenopathy present.       Head (left side): No tonsillar, no preauricular, no posterior auricular and no occipital adenopathy present.    She has no cervical adenopathy.    She has no axillary adenopathy.       Right: No inguinal and no supraclavicular adenopathy present.       Left: No inguinal and no supraclavicular adenopathy present.  Neurological: She is alert and oriented to person, place, and time. She has normal strength and normal reflexes. No cranial nerve deficit. She exhibits normal muscle tone. Coordination and gait normal.  Skin: Skin is warm, dry and intact. No rash noted. She is not diaphoretic. No cyanosis or erythema. Nails show no clubbing.  Psychiatric: She has a normal mood and affect. Her speech is normal and behavior is normal. Judgment and thought content normal.       Assessment & Plan:   Problem List Items Addressed This  Visit    None    Visit Diagnoses    Annual physical exam    -  Primary   Age appropriate health guidance provided   Relevant Orders   Care order/instruction: (Completed)   Bipolar affective disorder, remission status unspecified (HCC)       Stable. Continue specialty follow-up   Screening for cervical cancer  If ASCUS, +/- HPV, will refer to GYN   Relevant Orders   Pap IG w/ reflex to HPV when ASC-U   Need for Tdap vaccination       Relevant Orders   Tdap vaccine greater than or equal to 7yo IM (Completed)   Screening for hyperlipidemia       Relevant Orders   Lipid panel   Screening for metabolic disorder       Relevant Orders   Comprehensive metabolic panel   Leukopenia, unspecified type       likely due to viral illness at the time of last test. Anticipate it has normalized.   Relevant Orders   CBC with Differential/Platelet   Screening for thyroid disorder       Relevant Orders   TSH   T4, free       Return in about 1 year (around 12/10/2017) for wellness exam.   Fernande Bras, PA-C Primary Care at [facility]

## 2016-12-10 NOTE — Progress Notes (Signed)
Subjective:    Patient ID: Shelby Bennett, female    DOB: 1991/01/07, 26 y.o.   MRN: [id number] PCP: Porfirio Oar, PA-C Chief Complaint  Patient presents with  . Annual Exam    with pap    HPI Patient presents for annual exam. Says that she is feeling well. Abilify is working well. F/u at [facility] soon. Sleeping well. Appetite is good.  Energy is good for the first time in 10 years. Started going to the gym. Pain has improved. "Feels like a normal person for the first time in a while". Taking klonopin sparing, only needing it for the anxiety, doesn't want to become tolerant. Hasn't had full panic attack since 5/11. CBD- instead of the klonopin for the sleep and pain- working well and not needing to take klonopin and ibuprofen as often. Checked to make sure it wouldn't interfer with current meds. Husband is getting hysterectomy (transgender husband) and sister is going through some things and she feels as though she is handling it well.  Her last GYN appointment was 4 years ago where she was diagnosed with HSV and HPV with  irregular cytology.  Pain in shoulders, neck, knees and low back- gets massage q 2 weeks- feels as though they help.  Sees eye doctor regular- oscar oglethgorp [address] [facility] Doesn't see dentist regularly- only if there is a problem.   Patient Active Problem List   Diagnosis Date Noted  . Bipolar disorder current episode depressed (HCC) 01/27/2016  . Bipolar 1 disorder, depressed, severe (HCC) 01/27/2016  . Bipolar affective disorder, current episode manic (HCC) 10/10/2012    Class: Acute  . Anxiety and depression 04/14/2012  . ADHD (attention deficit hyperactivity disorder) 04/14/2012   Prior to Admission medications   Medication Sig Start Date End Date Taking? Authorizing Provider  ARIPiprazole (ABILIFY) 2 MG tablet Take 0.25 mg by mouth daily.   Yes [provider]  clonazePAM (KLONOPIN) 1 MG tablet Take 1 mg by mouth 2 (two) times daily.   Yes  [provider]  DULoxetine (CYMBALTA) 60 MG capsule Take 20 mg by mouth daily.   Yes [provider]  ibuprofen (ADVIL,MOTRIN) 800 MG tablet Take 1 tablet (800 mg total) by mouth 3 (three) times daily. For moderate pain 01/30/16  Yes Nwoko, Nicole Kindred I, NP  Azelastine HCl 0.15 % SOLN Place 2 sprays into both nostrils 2 (two) times daily. Patient not taking: Reported on 12/10/2016 11/12/16   Porfirio Oar, PA-C   Allergies  Allergen Reactions  . 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 Zithromax [Azithromycin Dihydrate] Diarrhea and Nausea And Vomiting  . Latex Swelling  . Debby Freiberg Er]     Suicidal rash  . Lavender Oil Hives  . Lithium Other (See Comments)    Intolerance, change in mental status.  . Lamictal [Lamotrigine] Rash   Family History  Problem Relation Age of Onset  . Mental illness Sister        PMDD  . Cervical cancer Sister        s/p ablation  . Mental illness Brother        depression  . Mental illness Brother        depression  . Mental illness Mother        bipolar  . Migraines Father   . Colon cancer Maternal Grandfather   . Esophageal cancer Neg Hx   . Rectal cancer Neg Hx   . Stomach cancer  Neg Hx    Past Surgical History:  Procedure Laterality Date  . APPENDECTOMY     Social History   Social History  . Marital status: Married    Spouse name: Coen B. Cerone  . Number of children: 0  . Years of education: 12+   Occupational History  . massage therapist     [employer]   Social History Main Topics  . Smoking status: Current Every Day Smoker    Packs/day: 0.70    Years: 7.00    Types: Cigarettes    Last attempt to quit: 08/29/2011  . Smokeless tobacco: Never Used  . Alcohol use No  . Drug use: No  . Sexual activity: Yes    Birth control/ protection: None   Other Topics Concern  . Not on file   Social History Narrative   Massage therapist   Lives with her husband, who is  female-to-female transgender.    Review of Systems  Constitutional: Negative for fatigue and unexpected weight change.  HENT: Positive for congestion, postnasal drip and sinus pressure. Negative for dental problem, drooling, ear discharge, ear pain, facial swelling, hearing loss, mouth sores, nosebleeds, rhinorrhea, sinus pain, sneezing, sore throat, tinnitus, trouble swallowing and voice change.   Eyes: Negative.   Cardiovascular: Negative.   Gastrointestinal: Negative.   Endocrine: Negative.   Genitourinary: Negative.   Musculoskeletal: Positive for arthralgias, back pain and neck pain.  Skin: Negative.   Allergic/Immunologic: Positive for environmental allergies. Negative for food allergies and immunocompromised state.  Neurological: Positive for headaches (associates with nasal congestion/sinus pressure). Negative for dizziness, tremors, seizures, syncope, facial asymmetry, speech difficulty, weakness, light-headedness and numbness.  Hematological: Negative.   Psychiatric/Behavioral: Negative.        Objective:   Physical Exam  Constitutional: She is oriented to person, place, and time. She appears well-developed and well-nourished. No distress.  HENT:  Head: Normocephalic and atraumatic.  Eyes: Conjunctivae and EOM are normal. Pupils are equal, round, and reactive to light.  Neck: Normal range of motion. Neck supple.  Cardiovascular: Normal rate, regular rhythm, normal heart sounds and intact distal pulses.   Pulmonary/Chest: Effort normal and breath sounds normal.  Abdominal: Soft. Bowel sounds are normal. Hernia confirmed negative in the right inguinal area and confirmed negative in the left inguinal area.  Genitourinary: Vagina normal.    No breast swelling, tenderness, discharge or bleeding. Pelvic exam was performed with patient supine. No labial fusion. There is lesion on the right labia. There is no rash, tenderness or injury on the right labia. There is no rash,  tenderness, lesion or injury on the left labia. No erythema, tenderness or bleeding in the vagina. No foreign body in the vagina. No signs of injury around the vagina. No vaginal discharge found.  Lymphadenopathy:       Right: No inguinal adenopathy present.       Left: No inguinal adenopathy present.  Neurological: She is alert and oriented to person, place, and time.   BP 122/79 (BP Location: Right Arm, Patient Position: Sitting, Cuff Size: Normal)   Pulse 76   Temp 97.3 F (36.3 C) (Oral)   Resp 16   Ht 5' 9.75" (1.772 m)   Wt 164 lb 9.6 oz (74.7 kg)   LMP 12/01/2016   SpO2 98%   BMI 23.79 kg/m       Assessment & Plan:  1. Annual physical exam  2. Bipolar affective disorder, remission status unspecified (HCC) -Abilify .25 mg 1x day  3. Screening for cervical cancer - Pap IG w/ reflex to HPV when ASC-U  4. Need for Tdap vaccination - Tdap vaccine greater than or equal to 7yo IM  5. Screening for hyperlipidemia - Lipid panel  6. Screening for metabolic disorder - Comprehensive metabolic panel  7. Leukopenia, unspecified type - CBC with Differential/Platelet  8. Screening for thyroid disorder - TSH - T4, free  Return in about 1 year (around 12/10/2017) for wellness exam.

## 2016-12-10 NOTE — Patient Instructions (Addendum)
   IF you received an x-ray today, you will receive an invoice from [facility]. Please contact [facility] at [phone] with questions or concerns regarding your invoice.   IF you received labwork today, you will receive an invoice from [facility]. Please contact [facility] at [phone] with questions or concerns regarding your invoice.   Our billing staff will not be able to assist you with questions regarding bills from these companies.  You will be contacted with the lab results as soon as they are available. The fastest way to get your results is to activate your My Chart account. Instructions are located on the last page of this paperwork. If you have not heard from us regarding the results in 2 weeks, please contact this office.    Keeping You Healthy  Get These Tests 1. Blood Pressure- Have your blood pressure checked once a year by your health care provider.  Normal blood pressure is 120/80. 2. Weight- Have your body mass index (BMI) calculated to screen for obesity.  BMI is measure of body fat based on height and weight.  You can also calculate your own BMI at [email or web address].[email or web address]/bmi/. 3. Cholesterol- Have your cholesterol checked every 5 years starting at age 20 then yearly starting at age 45. 4. Chlamydia, HIV, and other sexually transmitted diseases- Get screened every year until age 25, then within three months of each new sexual provider. 5. Pap Test - Every 1-5 years; discuss with your health care provider. 6. Mammogram- Every 1-2 years starting at age 40--50  Take these medicines  Calcium with Vitamin D-Your body needs 1200 mg of Calcium each day and 800-1000 IU of Vitamin D daily.  Your body can only absorb 500 mg of Calcium at a time so Calcium must be taken in 2 or 3 divided doses throughout the day.  Multivitamin with folic acid- Once daily if it is possible for you to become pregnant.  Get these Immunizations  Gardasil-Series of three doses;  prevents HPV related illness such as genital warts and cervical cancer.  Menactra-Single dose; prevents meningitis.  Tetanus shot- Every 10 years.  Flu shot-Every year.  Take these steps 1. Do not smoke-Your healthcare provider can help you quit.  For tips on how to quit go to www.smokefree.gov or call 1-800 QUITNOW. 2. Be physically active- Exercise 5 days a week for at least 30 minutes.  If you are not already physically active, start slow and gradually work up to 30 minutes of moderate physical activity.  Examples of moderate activity include walking briskly, dancing, swimming, bicycling, etc. 3. Breast Cancer- A self breast exam every month is important for early detection of breast cancer.  For more information and instruction on self breast exams, ask your healthcare provider or www.womenshealth.gov/faq/breast-self-exam.cfm. 4. Eat a healthy diet- Eat a variety of healthy foods such as fruits, vegetables, whole grains, low fat milk, low fat cheeses, yogurt, lean meats, poultry and fish, beans, nuts, tofu, etc.  For more information go to www. Thenutritionsource.org 5. Drink alcohol in moderation- Limit alcohol intake to one drink or less per day. Never drink and drive. 6. Depression- Your emotional health is as important as your physical health.  If you're feeling down or losing interest in things you normally enjoy please talk to your healthcare provider about being screened for depression. 7. Dental visit- Brush and floss your teeth twice daily; visit your dentist twice a year. 8. Eye doctor- Get an eye exam at least every 2 years. 9. Helmet   use- Always wear a helmet when riding a bicycle, motorcycle, rollerblading or skateboarding. 10. Safe sex- If you may be exposed to sexually transmitted infections, use a condom. 11. Seat belts- Seat belts can save your live; always wear one. 12. Smoke/Carbon Monoxide detectors- These detectors need to be installed on the appropriate level of your  home. Replace batteries at least once a year. 13. Skin cancer- When out in the sun please cover up and use sunscreen 15 SPF or higher. 14. Violence- If anyone is threatening or hurting you, please tell your healthcare provider.        

## 2016-12-11 LAB — COMPREHENSIVE METABOLIC PANEL
A/G RATIO: 2.2 (ref 1.2–2.2)
ALT: 8 IU/L (ref 0–32)
AST: 12 IU/L (ref 0–40)
Albumin: 4.3 g/dL (ref 3.5–5.5)
Alkaline Phosphatase: 32 IU/L — ABNORMAL LOW (ref 39–117)
BUN/Creatinine Ratio: 23 (ref 9–23)
BUN: 14 mg/dL (ref 6–20)
Bilirubin Total: 0.2 mg/dL (ref 0.0–1.2)
CO2: 23 mmol/L (ref 18–29)
Calcium: 8.8 mg/dL (ref 8.7–10.2)
Chloride: 105 mmol/L (ref 96–106)
Creatinine, Ser: 0.61 mg/dL (ref 0.57–1.00)
GFR calc non Af Amer: 125 mL/min/{1.73_m2} (ref >59)
GFR, EST AFRICAN AMERICAN: 144 mL/min/{1.73_m2} (ref >59)
GLOBULIN, TOTAL: 2 g/dL (ref 1.5–4.5)
Glucose: 86 mg/dL (ref 65–99)
POTASSIUM: 4.5 mmol/L (ref 3.5–5.2)
SODIUM: 141 mmol/L (ref 134–144)
TOTAL PROTEIN: 6.3 g/dL (ref 6.0–8.5)

## 2016-12-11 LAB — CBC WITH DIFFERENTIAL/PLATELET
BASOS ABS: 0 10*3/uL (ref 0.0–0.2)
BASOS: 1 %
EOS (ABSOLUTE): 0.1 10*3/uL (ref 0.0–0.4)
Eos: 3 %
HEMOGLOBIN: 13.4 g/dL (ref 11.1–15.9)
Hematocrit: 39.4 % (ref 34.0–46.6)
Immature Grans (Abs): 0 10*3/uL (ref 0.0–0.1)
Immature Granulocytes: 1 %
LYMPHS ABS: 1.5 10*3/uL (ref 0.7–3.1)
Lymphs: 33 %
MCH: 32.1 pg (ref 26.6–33.0)
MCHC: 34 g/dL (ref 31.5–35.7)
MCV: 94 fL (ref 79–97)
Monocytes Absolute: 0.3 10*3/uL (ref 0.1–0.9)
Monocytes: 7 %
NEUTROS ABS: 2.4 10*3/uL (ref 1.4–7.0)
Neutrophils: 55 %
Platelets: 202 10*3/uL (ref 150–379)
RBC: 4.18 x10E6/uL (ref 3.77–5.28)
RDW: 13.4 % (ref 12.3–15.4)
WBC: 4.4 10*3/uL (ref 3.4–10.8)

## 2016-12-11 LAB — LIPID PANEL
CHOL/HDL RATIO: 2 ratio (ref 0.0–4.4)
Cholesterol, Total: 112 mg/dL (ref 100–199)
HDL: 55 mg/dL (ref >39)
LDL Calculated: 49 mg/dL (ref 0–99)
TRIGLYCERIDES: 38 mg/dL (ref 0–149)
VLDL Cholesterol Cal: 8 mg/dL (ref 5–40)

## 2016-12-11 LAB — T4, FREE: Free T4: 0.97 ng/dL (ref 0.82–1.77)

## 2016-12-11 LAB — TSH: TSH: 1.7 u[IU]/mL (ref 0.450–4.500)

## 2016-12-12 LAB — PAP IG W/ RFLX HPV ASCU: PAP Smear Comment: 0

## 2017-10-09 ENCOUNTER — Encounter: Payer: Self-pay | Admitting: Physician Assistant
